# Patient Record
Sex: Female | Born: 1990 | Race: White | Hispanic: No | Marital: Single | Smoking: Never smoker
Health system: Southern US, Community
[De-identification: ages and names within clinical notes are randomized; demographics above are authoritative.]

## PROBLEM LIST (undated history)

## (undated) DIAGNOSIS — Z8742 Personal history of other diseases of the female genital tract: Secondary | ICD-10-CM

## (undated) DIAGNOSIS — T7840XA Allergy, unspecified, initial encounter: Secondary | ICD-10-CM

## (undated) DIAGNOSIS — A749 Chlamydial infection, unspecified: Secondary | ICD-10-CM

## (undated) DIAGNOSIS — Z973 Presence of spectacles and contact lenses: Secondary | ICD-10-CM

## (undated) DIAGNOSIS — N898 Other specified noninflammatory disorders of vagina: Secondary | ICD-10-CM

## (undated) DIAGNOSIS — Z309 Encounter for contraceptive management, unspecified: Principal | ICD-10-CM

## (undated) DIAGNOSIS — F419 Anxiety disorder, unspecified: Secondary | ICD-10-CM

## (undated) DIAGNOSIS — Z8619 Personal history of other infectious and parasitic diseases: Principal | ICD-10-CM

## (undated) DIAGNOSIS — A599 Trichomoniasis, unspecified: Secondary | ICD-10-CM

## (undated) DIAGNOSIS — B379 Candidiasis, unspecified: Secondary | ICD-10-CM

## (undated) DIAGNOSIS — K219 Gastro-esophageal reflux disease without esophagitis: Secondary | ICD-10-CM

## (undated) DIAGNOSIS — N83209 Unspecified ovarian cyst, unspecified side: Secondary | ICD-10-CM

## (undated) DIAGNOSIS — B9689 Other specified bacterial agents as the cause of diseases classified elsewhere: Secondary | ICD-10-CM

## (undated) DIAGNOSIS — N760 Acute vaginitis: Secondary | ICD-10-CM

## (undated) DIAGNOSIS — R87629 Unspecified abnormal cytological findings in specimens from vagina: Secondary | ICD-10-CM

## (undated) HISTORY — DX: Other specified noninflammatory disorders of vagina: N89.8

## (undated) HISTORY — DX: Allergy, unspecified, initial encounter: T78.40XA

## (undated) HISTORY — DX: Encounter for contraceptive management, unspecified: Z30.9

## (undated) HISTORY — DX: Gastro-esophageal reflux disease without esophagitis: K21.9

## (undated) HISTORY — PX: FOOT SURGERY: SHX648

## (undated) HISTORY — DX: Acute vaginitis: N76.0

## (undated) HISTORY — DX: Personal history of other infectious and parasitic diseases: Z86.19

## (undated) HISTORY — DX: Anxiety disorder, unspecified: F41.9

## (undated) HISTORY — DX: Candidiasis, unspecified: B37.9

## (undated) HISTORY — DX: Trichomoniasis, unspecified: A59.9

## (undated) HISTORY — DX: Other specified bacterial agents as the cause of diseases classified elsewhere: B96.89

## (undated) HISTORY — DX: Unspecified abnormal cytological findings in specimens from vagina: R87.629

## (undated) HISTORY — DX: Personal history of other diseases of the female genital tract: Z87.42

## (undated) HISTORY — DX: Chlamydial infection, unspecified: A74.9

---

## 2009-01-14 HISTORY — PX: FOOT SURGERY: SHX648

## 2013-04-15 ENCOUNTER — Emergency Department (HOSPITAL_COMMUNITY)
Admission: EM | Admit: 2013-04-15 | Discharge: 2013-04-15 | Disposition: A | Discharge status: Home / Self Care | Payer: Self-pay | Attending: Emergency Medicine | Admitting: Emergency Medicine

## 2013-04-15 ENCOUNTER — Encounter (HOSPITAL_COMMUNITY): Payer: Self-pay | Admitting: Emergency Medicine

## 2013-04-15 DIAGNOSIS — S76219A Strain of adductor muscle, fascia and tendon of unspecified thigh, initial encounter: Secondary | ICD-10-CM

## 2013-04-15 DIAGNOSIS — X58XXXA Exposure to other specified factors, initial encounter: Secondary | ICD-10-CM | POA: Insufficient documentation

## 2013-04-15 DIAGNOSIS — Y9289 Other specified places as the place of occurrence of the external cause: Secondary | ICD-10-CM | POA: Insufficient documentation

## 2013-04-15 DIAGNOSIS — Y9389 Activity, other specified: Secondary | ICD-10-CM | POA: Insufficient documentation

## 2013-04-15 DIAGNOSIS — IMO0002 Reserved for concepts with insufficient information to code with codable children: Secondary | ICD-10-CM | POA: Insufficient documentation

## 2013-04-15 NOTE — ED Provider Notes (Signed)
CSN: [id number]     Arrival date & time 04/15/13  0941 History   First MD Initiated Contact with Patient 04/15/13 1057     Chief Complaint  Patient presents with  . Leg Pain     (Consider location/radiation/quality/duration/timing/severity/associated sxs/prior Treatment) HPI Comments: Patient is a 23 year old female who presents to the emergency department with right upper thigh pain. The patient states that this started to bother her early this morning when she awakened. She is unaware of any known injury. The patient states that she takes a walk almost every day, and there are inclines and declines in the past that she does walking. She's not had any previous operations or procedures involving the right thigh or groin area. Patient presents now for evaluation of this particular problem.  The history is provided by the patient.    History reviewed. No pertinent past medical history. Past Surgical History  Procedure Laterality Date  . Foot surgery     No family history on file. History  Substance Use Topics  . Smoking status: Never Smoker   . Smokeless tobacco: Not on file  . Alcohol Use: Yes     Comment: occasional   OB History   Grav Para Term Preterm Abortions TAB SAB Ect Mult Living                 Review of Systems  Constitutional: Negative for activity change.       All ROS Neg except as noted in HPI  HENT: Negative for nosebleeds.   Eyes: Negative for photophobia and discharge.  Respiratory: Negative for cough, shortness of breath and wheezing.   Cardiovascular: Negative for chest pain and palpitations.  Gastrointestinal: Negative for abdominal pain and blood in stool.  Genitourinary: Negative for dysuria, frequency and hematuria.  Musculoskeletal: Positive for myalgias. Negative for arthralgias, back pain and neck pain.  Skin: Negative.   Neurological: Negative for dizziness, seizures and speech difficulty.  Psychiatric/Behavioral: Negative for hallucinations and  confusion.      Allergies  Review of patient's allergies indicates no known allergies.  Home Medications  No current outpatient prescriptions on file. BP 120/70  Pulse 96  Temp(Src) 97.8 F (36.6 C)  Resp 18  Ht 5\' 6"  (1.676 m)  Wt 182 lb (82.555 kg)  BMI 29.39 kg/m2  SpO2 100%  LMP 03/25/2013 Physical Exam  Nursing note and vitals reviewed. Constitutional: She is oriented to person, place, and time. She appears well-developed and well-nourished.  Non-toxic appearance.  HENT:  Head: Normocephalic.  Right Ear: Tympanic membrane and external ear normal.  Left Ear: Tympanic membrane and external ear normal.  Eyes: EOM and lids are normal. Pupils are equal, round, and reactive to light.  Neck: Normal range of motion. Neck supple. Carotid bruit is not present.  Cardiovascular: Normal rate, regular rhythm, normal heart sounds, intact distal pulses and normal pulses.   Pulmonary/Chest: Breath sounds normal. No respiratory distress.  Abdominal: Soft. Bowel sounds are normal. There is no tenderness. There is no guarding.  Musculoskeletal: Normal range of motion.  There is full range of motion of the toes on the right. Full range of motion of the ankle. The Achilles tendon is intact. There is no red streaking of the upper or lower right leg. There is no calf tenderness or swelling noted. With range of motion there is spasm of the upper thigh extending into the lower abdomen and groin. There is no evidence of hernia.  Lymphadenopathy:  Head (right side): No submandibular adenopathy present.       Head (left side): No submandibular adenopathy present.    She has no cervical adenopathy.  Neurological: She is alert and oriented to person, place, and time. She has normal strength. No cranial nerve deficit or sensory deficit.  Skin: Skin is warm and dry.  Psychiatric: She has a normal mood and affect. Her speech is normal.    ED Course  Procedures (including critical care time) Labs  Review Labs Reviewed - No data to display Imaging Review No results found.   EKG Interpretation None      MDM Vital signs are well within normal limits. The pulse oximetry is 100% on room air. Within normal limits by my interpretation.  The examination is consistent with a groin strain. I've instructed the patient to use warm tub soaks one or 2 times daily. I've instructed her to use 600 mg of ibuprofen 3 or 4 times daily with food. I've asked her to rest her thigh and groin area is much as possible.    Final diagnoses:  None    *I have reviewed nursing notes, vital signs, and all appropriate lab and imaging results for this patient.Kathie Dike, PA-C 04/15/13 1137

## 2013-04-15 NOTE — ED Notes (Signed)
C/O pain rt thigh area unrelated to injury or anything. Describes pain as "shooting".

## 2013-04-15 NOTE — Discharge Instructions (Signed)
Your examination is consistent with a groin strain. Please use warm tub soaks one or 2 times daily. Please rest your thigh and groin area is much as possible. Use ibuprofen 600 mg 3 or 4 times daily with food. Please see your primary physician, or Dr. Hilda LiasKeeling for additional evaluation and management if not improving.

## 2013-04-15 NOTE — ED Notes (Signed)
Pt c/o right upper thigh pain that radiates into right groin and abdomen with nausea. Denies v/d.

## 2013-04-15 NOTE — ED Provider Notes (Signed)
Medical screening examination/treatment/procedure(s) were performed by non-physician practitioner and as supervising physician I was immediately available for consultation/collaboration.   EKG Interpretation None        Mackenzie CoKevin M Amias Hutchinson, MD 04/15/13 1140

## 2013-06-02 ENCOUNTER — Encounter: Payer: BC Managed Care – PPO | Admitting: Adult Health

## 2013-06-02 ENCOUNTER — Encounter: Payer: Self-pay | Admitting: Adult Health

## 2013-06-02 ENCOUNTER — Ambulatory Visit (INDEPENDENT_AMBULATORY_CARE_PROVIDER_SITE_OTHER): Payer: BC Managed Care – PPO | Admitting: Adult Health

## 2013-06-02 VITALS — BP 122/70 | Ht 65.5 in | Wt 184.0 lb

## 2013-06-02 DIAGNOSIS — Z309 Encounter for contraceptive management, unspecified: Secondary | ICD-10-CM | POA: Insufficient documentation

## 2013-06-02 DIAGNOSIS — Z3202 Encounter for pregnancy test, result negative: Secondary | ICD-10-CM

## 2013-06-02 DIAGNOSIS — Z3049 Encounter for surveillance of other contraceptives: Secondary | ICD-10-CM

## 2013-06-02 LAB — POCT URINE PREGNANCY: Preg Test, Ur: NEGATIVE

## 2013-06-02 MED ORDER — NORGESTIMATE-ETH ESTRADIOL 0.25-35 MG-MCG PO TABS: 1.0000 | ORAL_TABLET | Freq: Every day | ORAL | Status: DC

## 2013-06-02 NOTE — Progress Notes (Signed)
Subjective:     Patient ID: Mackenzie Harris, female   DOB: 1990/08/30, 23 y.o.   MRN: [id number]  HPI Helmut Musterlicia is a 23 year old white female in to get on birth control.No complaints.  Review of Systems See HPI Reviewed past medical,surgical, social and family history. Reviewed medications and allergies.     Objective:   Physical Exam BP 122/70  Ht 5' 5.5" (1.664 m)  Wt 184 lb (83.462 kg)  BMI 30.14 kg/m2  LMP 05/07/2015UPT negative, talk only, wants to get back on birth control and wants the pill.    Assessment:       Contraceptive management  Plan:     Rx sprintec disp 3 packs take 1 daily with 4 refills, start with next period and use condoms Follow up in 3 months Review handout on OC use

## 2013-06-02 NOTE — Patient Instructions (Signed)
Oral Contraception Use Oral contraceptive pills (OCPs) are medicines taken to prevent pregnancy. OCPs work by preventing the ovaries from releasing eggs. The hormones in OCPs also cause the cervical mucus to thicken, preventing the sperm from entering the uterus. The hormones also cause the uterine lining to become thin, not allowing a fertilized egg to attach to the inside of the uterus. OCPs are highly effective when taken exactly as prescribed. However, OCPs do not prevent sexually transmitted diseases (STDs). Safe sex practices, such as using condoms along with an OCP, can help prevent STDs. Before taking OCPs, you may have a physical exam and Pap test. Your health care provider may also order blood tests if necessary. Your health care provider will make sure you are a good candidate for oral contraception. Discuss with your health care provider the possible side effects of the OCP you may be prescribed. When starting an OCP, it can take 2 to 3 months for the body to adjust to the changes in hormone levels in your body.  HOW TO TAKE ORAL CONTRACEPTIVE PILLS Your health care provider may advise you on how to start taking the first cycle of OCPs. Otherwise, you can:   Start on day 1 of your menstrual period. You will not need any backup contraceptive protection with this start time.   Start on the first Sunday after your menstrual period or the day you get your prescription. In these cases, you will need to use backup contraceptive protection for the first week.   Start the pill at any time of your cycle. If you take the pill within 5 days of the start of your period, you are protected against pregnancy right away. In this case, you will not need a backup form of birth control. If you start at any other time of your menstrual cycle, you will need to use another form of birth control for 7 days. If your OCP is the type called a minipill, it will protect you from pregnancy after taking it for 2 days (48  hours). After you have started taking OCPs:   If you forget to take 1 pill, take it as soon as you remember. Take the next pill at the regular time.   If you miss 2 or more pills, call your health care provider because different pills have different instructions for missed doses. Use backup birth control until your next menstrual period starts.   If you use a 28-day pack that contains inactive pills and you miss 1 of the last 7 pills (pills with no hormones), it will not matter. Throw away the rest of the nonhormone pills and start a new pill pack.  No matter which day you start the OCP, you will always start a new pack on that same day of the week. Have an extra pack of OCPs and a backup contraceptive method available in case you miss some pills or lose your OCP pack.  HOME CARE INSTRUCTIONS   Do not smoke.   Always use a condom to protect against STDs. OCPs do not protect against STDs.   Use a calendar to mark your menstrual period days.   Read the information and directions that came with your OCP. Talk to your health care provider if you have questions.  SEEK MEDICAL CARE IF:   You develop nausea and vomiting.   You have abnormal vaginal discharge or bleeding.   You develop a rash.   You miss your menstrual period.   You are losing   your hair.   You need treatment for mood swings or depression.   You get dizzy when taking the OCP.   You develop acne from taking the OCP.   You become pregnant.  SEEK IMMEDIATE MEDICAL CARE IF:   You develop chest pain.   You develop shortness of breath.   You have an uncontrolled or severe headache.   You develop numbness or slurred speech.   You develop visual problems.   You develop pain, redness, and swelling in the legs.  Document Released: 12/20/2010 Document Revised: 09/02/2012 Document Reviewed: 06/21/2012 [facility] Patient Information 2014 [place], [place]. Start with next period Use condoms Follow up  in 3 months

## 2013-06-16 ENCOUNTER — Encounter: Payer: BC Managed Care – PPO | Admitting: Adult Health

## 2014-02-02 ENCOUNTER — Ambulatory Visit (INDEPENDENT_AMBULATORY_CARE_PROVIDER_SITE_OTHER): Payer: BLUE CROSS/BLUE SHIELD | Admitting: Adult Health

## 2014-02-02 ENCOUNTER — Other Ambulatory Visit (HOSPITAL_COMMUNITY)
Admission: RE | Admit: 2014-02-02 | Discharge: 2014-02-02 | Disposition: A | Discharge status: Home / Self Care | Payer: BLUE CROSS/BLUE SHIELD | Source: Ambulatory Visit | Attending: Adult Health | Admitting: Adult Health

## 2014-02-02 ENCOUNTER — Encounter: Payer: Self-pay | Admitting: Adult Health

## 2014-02-02 VITALS — BP 116/72 | Ht 66.5 in | Wt 173.0 lb

## 2014-02-02 DIAGNOSIS — Z113 Encounter for screening for infections with a predominantly sexual mode of transmission: Secondary | ICD-10-CM | POA: Insufficient documentation

## 2014-02-02 DIAGNOSIS — Z01419 Encounter for gynecological examination (general) (routine) without abnormal findings: Secondary | ICD-10-CM

## 2014-02-02 DIAGNOSIS — Z01411 Encounter for gynecological examination (general) (routine) with abnormal findings: Secondary | ICD-10-CM | POA: Insufficient documentation

## 2014-02-02 DIAGNOSIS — Z3041 Encounter for surveillance of contraceptive pills: Secondary | ICD-10-CM

## 2014-02-02 MED ORDER — NORGESTIMATE-ETH ESTRADIOL 0.25-35 MG-MCG PO TABS: 1.0000 | ORAL_TABLET | Freq: Every day | ORAL | Status: DC

## 2014-02-02 NOTE — Progress Notes (Signed)
Patient ID: Mackenzie Harris, female   DOB: Dec 08, 1990, 24 y.o.   MRN: [id number] History of Present Illness: Mackenzie Harris is a 24 year old white female in for pap and physical.No complaints is happy with OCs.   Current Medications, Allergies, Past Medical History, Past Surgical History, Family History and Social History were [facility].     Review of Systems: Patient denies any headaches, blurred vision, shortness of breath, chest pain, abdominal pain, problems with bowel movements, urination, or intercourse. No joint pain or mood swings, does have headache and chest pain when gets anxious.    Physical Exam:BP 116/72 mmHg  Ht 5' 6.5" (1.689 m)  Wt 173 lb (78.472 kg)  BMI 27.51 kg/m2  LMP 12/31/2013 General:  Well developed, well nourished, no acute distress Skin:  Warm and dry Neck:  Midline trachea, normal thyroid Lungs; Clear to auscultation bilaterally Breast:  No dominant palpable mass, retraction, or nipple discharge Cardiovascular: Regular rate and rhythm Abdomen:  Soft, non tender, no hepatosplenomegaly Pelvic:  External genitalia is normal in appearance.  The vagina is normal in appearance, with period like blood at os and mucous.    The cervix is everted at os, pap with GC/CHL performed.  Uterus is felt to be normal size, shape, and contour.  No   adnexal masses or tenderness noted. Extremities:  No swelling or varicosities noted Psych:  No mood changes,alert and cooperative,seems happy, works 3rd shift. Declines labs.  Impression: Well woman gyn exam with pap Contraceptive management    Plan: Refilled sprintec x 1 year Physical in 1 year Call prn problems

## 2014-02-02 NOTE — Patient Instructions (Signed)
Physical in 1 year Call prn problems

## 2014-02-03 LAB — CYTOLOGY - PAP

## 2014-02-07 ENCOUNTER — Telehealth: Payer: Self-pay | Admitting: Adult Health

## 2014-02-07 NOTE — Telephone Encounter (Signed)
No voice mail, if she calls needs colpo

## 2014-02-07 NOTE — Telephone Encounter (Signed)
Pt aware pap abnormal needs colpo,, to make appt with Dr Emelda FearFerguson or Dr Despina HiddenEure

## 2014-02-14 ENCOUNTER — Encounter: Payer: Self-pay | Admitting: Obstetrics & Gynecology

## 2014-02-14 ENCOUNTER — Ambulatory Visit (INDEPENDENT_AMBULATORY_CARE_PROVIDER_SITE_OTHER): Payer: BLUE CROSS/BLUE SHIELD | Admitting: Obstetrics & Gynecology

## 2014-02-14 VITALS — BP 132/100 | Wt 161.0 lb

## 2014-02-14 DIAGNOSIS — Z3202 Encounter for pregnancy test, result negative: Secondary | ICD-10-CM

## 2014-02-14 DIAGNOSIS — R87622 Low grade squamous intraepithelial lesion on cytologic smear of vagina (LGSIL): Secondary | ICD-10-CM

## 2014-02-14 LAB — POCT URINE PREGNANCY: PREG TEST UR: NEGATIVE

## 2014-02-14 NOTE — Progress Notes (Signed)
Patient ID: Mackenzie Harris, female   DOB: 07/09/1990, 24 y.o.   MRN: [id number] Colposcopy Procedure Note  Indications: Pap smear 14 days months ago showed: low-grade squamous intraepithelial neoplasia (LGSIL - encompassing HPV,mild dysplasia,CIN I). The prior pap showed no abnormalities.  Prior cervical/vaginal disease: . Prior cervical treatment: .  Procedure Details  The risks and benefits of the procedure and Written informed consent obtained.  Speculum placed in vagina and excellent visualization of cervix achieved, cervix swabbed x 3 with acetic acid solution.  Findings: Cervix: no visible lesions, no mosaicism and no punctation; no biopsies taken. Wide extension of squamo columnar epithelium Vaginal inspection: normal without visible lesions. Vulvar colposcopy: vulvar colposcopy not performed.  Specimens: none  Complications: none.  Plan: Follow up Pap smear in 1 year and add HPV identification

## 2014-04-12 ENCOUNTER — Telehealth: Payer: Self-pay | Admitting: Adult Health

## 2014-04-12 NOTE — Telephone Encounter (Signed)
Spoke with pt. Pt stopped Sprintec in Jan. She had some spotting in Feb. Nothing in March. Negative home pregnancy test. I advised she would need to be seen. Pt voiced understanding. Call transferred to front desk for appt. JSY

## 2014-04-15 ENCOUNTER — Ambulatory Visit: Payer: BLUE CROSS/BLUE SHIELD | Admitting: Adult Health

## 2014-04-27 ENCOUNTER — Encounter (HOSPITAL_COMMUNITY): Payer: Self-pay | Admitting: *Deleted

## 2014-04-27 ENCOUNTER — Emergency Department (HOSPITAL_COMMUNITY)
Admission: EM | Admit: 2014-04-27 | Discharge: 2014-04-27 | Disposition: A | Discharge status: Home / Self Care | Payer: BLUE CROSS/BLUE SHIELD | Attending: Emergency Medicine | Admitting: Emergency Medicine

## 2014-04-27 DIAGNOSIS — K088 Other specified disorders of teeth and supporting structures: Secondary | ICD-10-CM | POA: Diagnosis present

## 2014-04-27 DIAGNOSIS — F419 Anxiety disorder, unspecified: Secondary | ICD-10-CM | POA: Diagnosis not present

## 2014-04-27 DIAGNOSIS — Z79899 Other long term (current) drug therapy: Secondary | ICD-10-CM | POA: Diagnosis not present

## 2014-04-27 DIAGNOSIS — K047 Periapical abscess without sinus: Secondary | ICD-10-CM | POA: Diagnosis not present

## 2014-04-27 MED ORDER — AMOXICILLIN 250 MG PO CAPS
500.0000 mg | ORAL_CAPSULE | Freq: Once | ORAL | Status: AC
  Administered 2014-04-27: 500 mg via ORAL
  Filled 2014-04-27: qty 2

## 2014-04-27 MED ORDER — TRAMADOL HCL 50 MG PO TABS
50.0000 mg | ORAL_TABLET | Freq: Once | ORAL | Status: AC
  Administered 2014-04-27: 50 mg via ORAL
  Filled 2014-04-27: qty 1

## 2014-04-27 MED ORDER — AMOXICILLIN 500 MG PO CAPS: 500.0000 mg | ORAL_CAPSULE | Freq: Three times a day (TID) | ORAL | Status: DC

## 2014-04-27 MED ORDER — TRAMADOL HCL 50 MG PO TABS: 50.0000 mg | ORAL_TABLET | Freq: Four times a day (QID) | ORAL | Status: DC | PRN

## 2014-04-27 NOTE — ED Provider Notes (Signed)
CSN: [id number]     Arrival date & time 04/27/14  1900 History   First MD Initiated Contact with Patient 04/27/14 2011     Chief Complaint  Patient presents with  . Dental Pain     (Consider location/radiation/quality/duration/timing/severity/associated sxs/prior Treatment) The history is provided by the patient.   Mackenzie Harris is a 24 y.o. female presenting with a 2 week history of dental pain and gingival swelling.   The patient has a history of decay and fracture of the tooth involved which has recently started to cause increased  pain.  There has been no fevers, chills, nausea or vomiting, also no complaint of difficulty swallowing, although chewing makes pain worse. She has had a bad taste and drainage from around the tooth.  The patient has tried oragel without relief of symptoms.  Ibuprofen provides transient relief.  She is scheduled to see a dentist within  The next 10 days.      Past Medical History  Diagnosis Date  . GERD (gastroesophageal reflux disease)   . Allergy   . Contraceptive management 06/02/2013  . Anxiety    Past Surgical History  Procedure Laterality Date  . Foot surgery     Family History  Problem Relation Age of Onset  . Diabetes Mother     borderline  . Hypertension Father   . Diabetes Maternal Grandmother   . Hypertension Maternal Grandmother   . Diabetes Maternal Grandfather   . Hypertension Maternal Grandfather   . Alzheimer's disease Paternal Grandfather    History  Substance Use Topics  . Smoking status: Never Smoker   . Smokeless tobacco: Never Used  . Alcohol Use: Yes     Comment: occasional   OB History    Gravida Para Term Preterm AB TAB SAB Ectopic Multiple Living   0 0 0 0 0 0 0 0 0 0      Review of Systems  Constitutional: Negative for fever.  HENT: Positive for dental problem. Negative for facial swelling and sore throat.   Respiratory: Negative for shortness of breath.   Musculoskeletal: Negative for neck pain and neck  stiffness.      Allergies  Review of patient's allergies indicates no known allergies.  Home Medications   Prior to Admission medications   Medication Sig Start Date End Date Taking? Authorizing Provider  ibuprofen (ADVIL,MOTRIN) 200 MG tablet Take 800 mg by mouth every 6 (six) hours as needed for moderate pain.   Yes Historical Provider, MD  norgestimate-ethinyl estradiol (ORTHO-CYCLEN,SPRINTEC,PREVIFEM) 0.25-35 MG-MCG tablet Take 1 tablet by mouth daily. 02/02/14  Yes Adline PotterJennifer A Griffin, NP  amoxicillin (AMOXIL) 500 MG capsule Take 1 capsule (500 mg total) by mouth 3 (three) times daily. 04/27/14   Burgess AmorJulie Eveleen Mcnear, PA-C  traMADol (ULTRAM) 50 MG tablet Take 1 tablet (50 mg total) by mouth every 6 (six) hours as needed. 04/27/14   Burgess AmorJulie Ethlyn Alto, PA-C   BP 116/67 mmHg  Pulse 64  Temp(Src) 98.2 F (36.8 C) (Oral)  Resp 20  Ht 5\' 7"  (1.702 m)  Wt 182 lb (82.555 kg)  BMI 28.50 kg/m2  SpO2 100%  LMP 04/11/2014 Physical Exam  Constitutional: She is oriented to person, place, and time. She appears well-developed and well-nourished. No distress.  HENT:  Head: Normocephalic and atraumatic.  Right Ear: Tympanic membrane and external ear normal.  Left Ear: Tympanic membrane and external ear normal.  Mouth/Throat: Oropharynx is clear and moist and mucous membranes are normal. No oral lesions. No trismus in the jaw.  Dental abscesses present.    Eyes: Conjunctivae are normal.  Neck: Normal range of motion. Neck supple.  Cardiovascular: Normal rate and normal heart sounds.   Pulmonary/Chest: Effort normal.  Abdominal: She exhibits no distension.  Musculoskeletal: Normal range of motion.  Lymphadenopathy:    She has no cervical adenopathy.  Neurological: She is alert and oriented to person, place, and time.  Skin: Skin is warm and dry. No erythema.  Psychiatric: She has a normal mood and affect.    ED Course  Procedures (including critical care time) Labs Review Labs Reviewed - No data to  display  Imaging Review No results found.   EKG Interpretation None      MDM   Final diagnoses:  Dental infection    Amoxil, tramadol.   F/u with dentistry which pt has arranged.    The patient appears reasonably screened and/or stabilized for discharge and I doubt any other medical condition or other [facility] requiring further screening, evaluation, or treatment in the ED at this time prior to discharge.     Burgess Amor, PA-C 04/27/14 2051  Mancel Bale, MD 04/28/14 [id number]

## 2014-04-27 NOTE — Discharge Instructions (Signed)
Dental Abscess A dental abscess is a collection of infected fluid (pus) from a bacterial infection in the inner part of the tooth (pulp). It usually occurs at the end of the tooth's root.  CAUSES   Severe tooth decay.  Trauma to the tooth that allows bacteria to enter into the pulp, such as a broken or chipped tooth. SYMPTOMS   Severe pain in and around the infected tooth.  Swelling and redness around the abscessed tooth or in the mouth or face.  Tenderness.  Pus drainage.  Bad breath.  Bitter taste in the mouth.  Difficulty swallowing.  Difficulty opening the mouth.  Nausea.  Vomiting.  Chills.  Swollen neck glands. DIAGNOSIS   A medical and dental history will be taken.  An examination will be performed by tapping on the abscessed tooth.  X-rays may be taken of the tooth to identify the abscess. TREATMENT The goal of treatment is to eliminate the infection. You may be prescribed antibiotic medicine to stop the infection from spreading. A root canal may be performed to save the tooth. If the tooth cannot be saved, it may be pulled (extracted) and the abscess may be drained.  HOME CARE INSTRUCTIONS  Only take over-the-counter or prescription medicines for pain, fever, or discomfort as directed by your caregiver.  Rinse your mouth (gargle) often with salt water ( tsp salt in 8 oz [250 ml] of warm water) to relieve pain or swelling.  Do not drive after taking pain medicine (narcotics).  Do not apply heat to the outside of your face.  Return to your dentist for further treatment as directed. SEEK MEDICAL CARE IF:  Your pain is not helped by medicine.  Your pain is getting worse instead of better. SEEK IMMEDIATE MEDICAL CARE IF:  You have a fever or persistent symptoms for more than 2-3 days.  You have a fever and your symptoms suddenly get worse.  You have chills or a very bad headache.  You have problems breathing or swallowing.  You have trouble  opening your mouth.  You have swelling in the neck or around the eye. Document Released: 12/31/2004 Document Revised: 09/25/2011 Document Reviewed: 04/10/2010 [facility] Patient Information 2015 [place], [place]. This information is not intended to replace advice given to you by your health care provider. Make sure you discuss any questions you have with your health care provider.  As discussed,  You probably do have early infection around this fractured tooth.  Complete your entire course of antibiotics as prescribed.  You  may use the tramadol for pain relief but do not drive within 4 hours of taking as this will make you drowsy.  Avoid applying heat or ice to this abscess area which can worsen your symptoms.  You may use warm salt water swish and spit treatment or half peroxide and water swish and spit after meals to keep this area clean as discussed.  See your dentist as planned.

## 2014-04-27 NOTE — ED Notes (Signed)
Lt upper dental pain for 2 weeks and getting worse

## 2014-06-09 ENCOUNTER — Encounter: Payer: Self-pay | Admitting: Adult Health

## 2014-06-09 ENCOUNTER — Ambulatory Visit (INDEPENDENT_AMBULATORY_CARE_PROVIDER_SITE_OTHER): Payer: BLUE CROSS/BLUE SHIELD | Admitting: Adult Health

## 2014-06-09 VITALS — BP 124/78 | HR 56 | Ht 66.0 in | Wt 189.5 lb

## 2014-06-09 DIAGNOSIS — B379 Candidiasis, unspecified: Secondary | ICD-10-CM | POA: Insufficient documentation

## 2014-06-09 DIAGNOSIS — L298 Other pruritus: Secondary | ICD-10-CM

## 2014-06-09 DIAGNOSIS — N898 Other specified noninflammatory disorders of vagina: Secondary | ICD-10-CM

## 2014-06-09 HISTORY — DX: Candidiasis, unspecified: B37.9

## 2014-06-09 LAB — POCT WET PREP (WET MOUNT): WBC, Wet Prep HPF POC: POSITIVE

## 2014-06-09 MED ORDER — FLUCONAZOLE 150 MG PO TABS: ORAL_TABLET | ORAL | Status: DC

## 2014-06-09 MED ORDER — NYSTATIN-TRIAMCINOLONE 100000-0.1 UNIT/GM-% EX OINT: 1.0000 "application " | TOPICAL_OINTMENT | Freq: Two times a day (BID) | CUTANEOUS | Status: DC

## 2014-06-09 NOTE — Patient Instructions (Signed)
Monilial Vaginitis Vaginitis in a soreness, swelling and redness (inflammation) of the vagina and vulva. Monilial vaginitis is not a sexually transmitted infection. CAUSES  Yeast vaginitis is caused by yeast (candida) that is normally found in your vagina. With a yeast infection, the candida has overgrown in number to a point that upsets the chemical balance. SYMPTOMS   White, thick vaginal discharge.  Swelling, itching, redness and irritation of the vagina and possibly the lips of the vagina (vulva).  Burning or painful urination.  Painful intercourse. DIAGNOSIS  Things that may contribute to monilial vaginitis are:  Postmenopausal and virginal states.  Pregnancy.  Infections.  Being tired, sick or stressed, especially if you had monilial vaginitis in the past.  Diabetes. Good control will help lower the chance.  Birth control pills.  Tight fitting garments.  Using bubble bath, feminine sprays, douches or deodorant tampons.  Taking certain medications that kill germs (antibiotics).  Sporadic recurrence can occur if you become ill. TREATMENT  Your caregiver will give you medication.  There are several kinds of anti monilial vaginal creams and suppositories specific for monilial vaginitis. For recurrent yeast infections, use a suppository or cream in the vagina 2 times a week, or as directed.  Anti-monilial or steroid cream for the itching or irritation of the vulva may also be used. Get your caregiver's permission.  Painting the vagina with methylene blue solution may help if the monilial cream does not work.  Eating yogurt may help prevent monilial vaginitis. HOME CARE INSTRUCTIONS   Finish all medication as prescribed.  Do not have sex until treatment is completed or after your caregiver tells you it is okay.  Take warm sitz baths.  Do not douche.  Do not use tampons, especially scented ones.  Wear cotton underwear.  Avoid tight pants and panty  hose.  Tell your sexual partner that you have a yeast infection. They should go to their caregiver if they have symptoms such as mild rash or itching.  Your sexual partner should be treated as well if your infection is difficult to eliminate.  Practice safer sex. Use condoms.  Some vaginal medications cause latex condoms to fail. Vaginal medications that harm condoms are:  Cleocin cream.  Butoconazole (Femstat).  Terconazole (Terazol) vaginal suppository.  Miconazole (Monistat) (may be purchased over the counter). SEEK MEDICAL CARE IF:   You have a temperature by mouth above 102 F (38.9 C).  The infection is getting worse after 2 days of treatment.  The infection is not getting better after 3 days of treatment.  You develop blisters in or around your vagina.  You develop vaginal bleeding, and it is not your menstrual period.  You have pain when you urinate.  You develop intestinal problems.  You have pain with sexual intercourse. Document Released: 10/10/2004 Document Revised: 03/25/2011 Document Reviewed: 06/24/2008 [facility] Patient Information 2015 [employer], [place]. This information is not intended to replace advice given to you by your health care provider. Make sure you discuss any questions you have with your health care provider. Follow up prn No more bubble baths

## 2014-06-09 NOTE — Progress Notes (Signed)
Subjective:     Patient ID: Mackenzie Harris, female   DOB: 1990-09-14, 24 y.o.   MRN: [id number]  HPI Helmut Musterlicia is a 24 year old white female in complaining of vaginal itch for about 3 days now.She did have bubble bath the day before the itching started.She has tried monistat without relief.  Review of Systems Patient denies any headaches, hearing loss, fatigue, blurred vision, shortness of breath, chest pain, abdominal pain, problems with bowel movements, urination, or intercourse. No joint pain or mood swings.+vaginal itch Reviewed past medical,surgical, social and family history. Reviewed medications and allergies.     Objective:   Physical Exam BP 124/78 mmHg  Pulse 56  Ht 5\' 6"  (1.676 m)  Wt 189 lb 8 oz (85.957 kg)  BMI 30.60 kg/m2  LMP 05/12/2014 Skin warm and dry.Pelvic: external genitalia is normal in appearance no lesions, vagina: white discharge, pink tinge, without odor, red at introitus,urethra has no lesions or masses noted, cervix:smooth, uterus: normal size, shape and contour, non tender, no masses felt, adnexa: no masses or tenderness noted. Bladder is non tender and no masses felt. Wet prep: + for few yeast and +WBCs.     Assessment:     Vaginal itch Vaginal discharge Yeast     Plan:     Rx diflucan 150 mg #2 take 1 now and 1 in 3 days with 1 refill Rx mytrex ointment use 2-3 x daily prn No more bubble baths Follow up prn Review handout on yeast infection

## 2015-02-28 ENCOUNTER — Ambulatory Visit (INDEPENDENT_AMBULATORY_CARE_PROVIDER_SITE_OTHER): Payer: BLUE CROSS/BLUE SHIELD | Admitting: Adult Health

## 2015-02-28 ENCOUNTER — Encounter: Payer: Self-pay | Admitting: Adult Health

## 2015-02-28 VITALS — BP 100/80 | HR 68 | Ht 66.0 in | Wt 207.0 lb

## 2015-02-28 DIAGNOSIS — Z8742 Personal history of other diseases of the female genital tract: Secondary | ICD-10-CM

## 2015-02-28 DIAGNOSIS — Z3202 Encounter for pregnancy test, result negative: Secondary | ICD-10-CM

## 2015-02-28 DIAGNOSIS — Z3041 Encounter for surveillance of contraceptive pills: Secondary | ICD-10-CM

## 2015-02-28 HISTORY — DX: Personal history of other diseases of the female genital tract: Z87.42

## 2015-02-28 LAB — POCT URINE PREGNANCY: PREG TEST UR: NEGATIVE

## 2015-02-28 MED ORDER — NORGESTIMATE-ETH ESTRADIOL 0.25-35 MG-MCG PO TABS: 1.0000 | ORAL_TABLET | Freq: Every day | ORAL | Status: DC

## 2015-02-28 NOTE — Progress Notes (Signed)
Subjective:     Patient ID: Mackenzie Harris, female   DOB: 04/24/1990, 25 y.o.   MRN: [id number]  HPI Mackenzie Harris is a 24 year old white female, in requesting refills on OCs.She had LSIL pap last year and had negative colpo, needs pap and physical.  Review of Systems Patient denies any headaches, hearing loss, fatigue, blurred vision, shortness of breath, chest pain, abdominal pain, problems with bowel movements, urination, or intercourse. No joint pain or mood swings. Reviewed past medical,surgical, social and family history. Reviewed medications and allergies.     Objective:   Physical Exam BP 100/80 mmHg  Pulse 68  Ht  (1.676 m)  Wt 207 lb (93.895 kg)  BMI 33.43 kg/m2  LMP 02/20/2015 UPT negtive, Skin warm and dry. Neck: mid line trachea, normal thyroid, good ROM, no lymphadenopathy noted. Lungs: clear to ausculation bilaterally. Cardiovascular: regular rate and rhythm.Will get scheduled for pap and physical ASAP.    Assessment:     Contraceptive management Hx abnormal pap    Plan:     Refilled spritnec Disp 3 packs take 1 daily with 4 refills,start back today and use condoms Return 2/24 for pap and physical

## 2015-02-28 NOTE — Patient Instructions (Signed)
Start back on sprintec today Use condoms  2/24 Pap and physical

## 2015-03-10 ENCOUNTER — Other Ambulatory Visit: Payer: BLUE CROSS/BLUE SHIELD | Admitting: Adult Health

## 2015-03-16 ENCOUNTER — Other Ambulatory Visit (HOSPITAL_COMMUNITY)
Admission: RE | Admit: 2015-03-16 | Discharge: 2015-03-16 | Disposition: A | Discharge status: Home / Self Care | Payer: BLUE CROSS/BLUE SHIELD | Source: Ambulatory Visit | Attending: Adult Health | Admitting: Adult Health

## 2015-03-16 ENCOUNTER — Ambulatory Visit (INDEPENDENT_AMBULATORY_CARE_PROVIDER_SITE_OTHER): Payer: BLUE CROSS/BLUE SHIELD | Admitting: Adult Health

## 2015-03-16 ENCOUNTER — Encounter: Payer: Self-pay | Admitting: Adult Health

## 2015-03-16 VITALS — BP 100/70 | HR 70 | Ht 66.0 in | Wt 211.5 lb

## 2015-03-16 DIAGNOSIS — Z1151 Encounter for screening for human papillomavirus (HPV): Secondary | ICD-10-CM | POA: Insufficient documentation

## 2015-03-16 DIAGNOSIS — N898 Other specified noninflammatory disorders of vagina: Secondary | ICD-10-CM | POA: Diagnosis not present

## 2015-03-16 DIAGNOSIS — Z01419 Encounter for gynecological examination (general) (routine) without abnormal findings: Secondary | ICD-10-CM

## 2015-03-16 DIAGNOSIS — Z8742 Personal history of other diseases of the female genital tract: Secondary | ICD-10-CM

## 2015-03-16 DIAGNOSIS — Z3041 Encounter for surveillance of contraceptive pills: Secondary | ICD-10-CM

## 2015-03-16 DIAGNOSIS — N760 Acute vaginitis: Secondary | ICD-10-CM

## 2015-03-16 DIAGNOSIS — Z113 Encounter for screening for infections with a predominantly sexual mode of transmission: Secondary | ICD-10-CM

## 2015-03-16 DIAGNOSIS — B9689 Other specified bacterial agents as the cause of diseases classified elsewhere: Secondary | ICD-10-CM

## 2015-03-16 HISTORY — DX: Other specified bacterial agents as the cause of diseases classified elsewhere: B96.89

## 2015-03-16 LAB — POCT WET PREP (WET MOUNT)
Clue Cells Wet Prep Whiff POC: NEGATIVE
WBC, Wet Prep HPF POC: POSITIVE

## 2015-03-16 MED ORDER — METRONIDAZOLE 500 MG PO TABS: 500.0000 mg | ORAL_TABLET | Freq: Two times a day (BID) | ORAL | Status: DC

## 2015-03-16 MED ORDER — FLUCONAZOLE 150 MG PO TABS: 150.0000 mg | ORAL_TABLET | Freq: Once | ORAL | Status: DC

## 2015-03-16 NOTE — Progress Notes (Signed)
Patient ID: Mackenzie Harris, female   DOB: Jul 19, 1990, 25 y.o.   MRN: [id number] History of Present Illness:  Mackenzie Harris is a 25 year old white female, in for a well woman gyn exam and pap, she had LSIL 02/02/14.She is happy with her OCs and has vaginal discharge with itching.Has used monistat.She says she is tired works 12 hour nights.  Current Medications, Allergies, Past Medical History, Past Surgical History, Family History and Social History were reviewed in [employer] record.     Review of Systems: Patient denies any headaches, hearing loss, blurred vision, shortness of breath, chest pain, abdominal pain, problems with bowel movements, urination, or intercourse. No joint pain or mood swings.See HPI for positives.    Physical Exam:BP 100/70 mmHg  Pulse 70  Ht  (1.676 m)  Wt 211 lb 8 oz (95.936 kg)  BMI 34.15 kg/m2  LMP 02/20/2015 General:  Well developed, well nourished, no acute distress Skin:  Warm and dry Neck:  Midline trachea, normal thyroid, good ROM, no lymphadenopathy Lungs; Clear to auscultation bilaterally Breast:  No dominant palpable mass, retraction, or nipple discharge Cardiovascular: Regular rate and rhythm Abdomen:  Soft, non tender, no hepatosplenomegaly Pelvic:  External genitalia is normal in appearance, no lesions.  The vagina is normal in appearance.Has creamy discharge, no odor. Urethra has no lesions or masses. The cervix is everted at os and friable with EC brush, pap with HPV and GC/CHL performed.  Uterus is felt to be normal size, shape, and contour.  No adnexal masses or tenderness noted.Bladder is non tender, no masses felt.Wet prep was + for WBCs and clue cells. Extremities/musculoskeletal:  No swelling or varicosities noted, no clubbing or cyanosis Psych:  No mood changes, alert and cooperative,seems happy Will check labs today.  Impression: Well woman gyn exam and pap History of abnormal pap Vaginal discharge Vaginal  itch BV Contraceptive management  STD screening   Plan: Continue sprintec, has refills Rx flagyl 500 mg 1 bid x 7 days, no alcohol, review handout on BV   Check CBC,CMP,TSH and lipids;A1c, vitamin D and HIV,RPR, and HSV 2 Physical in 1 year, pap in 3 if normal Rx diflucan 150 mg # 1, take 1 now with 3 refills

## 2015-03-16 NOTE — Patient Instructions (Signed)
Bacterial Vaginosis Bacterial vaginosis is a vaginal infection that occurs when the normal balance of bacteria in the vagina is disrupted. It results from an overgrowth of certain bacteria. This is the most common vaginal infection in women of childbearing age. Treatment is important to prevent complications, especially in pregnant women, as it can cause a premature delivery. CAUSES  Bacterial vaginosis is caused by an increase in harmful bacteria that are normally present in smaller amounts in the vagina. Several different kinds of bacteria can cause bacterial vaginosis. However, the reason that the condition develops is not fully understood. RISK FACTORS Certain activities or behaviors can put you at an increased risk of developing bacterial vaginosis, including:  Having a new sex partner or multiple sex partners.  Douching.  Using an intrauterine device (IUD) for contraception. Women do not get bacterial vaginosis from toilet seats, bedding, swimming pools, or contact with objects around them. SIGNS AND SYMPTOMS  Some women with bacterial vaginosis have no signs or symptoms. Common symptoms include:  Grey vaginal discharge.  A fishlike odor with discharge, especially after sexual intercourse.  Itching or burning of the vagina and vulva.  Burning or pain with urination. DIAGNOSIS  Your health care provider will take a medical history and examine the vagina for signs of bacterial vaginosis. A sample of vaginal fluid may be taken. Your health care provider will look at this sample under a microscope to check for bacteria and abnormal cells. A vaginal pH test may also be done.  TREATMENT  Bacterial vaginosis may be treated with antibiotic medicines. These may be given in the form of a pill or a vaginal cream. A second round of antibiotics may be prescribed if the condition comes back after treatment. Because bacterial vaginosis increases your risk for sexually transmitted diseases, getting  treated can help reduce your risk for chlamydia, gonorrhea, HIV, and herpes. HOME CARE INSTRUCTIONS   Only take over-the-counter or prescription medicines as directed by your health care provider.  If antibiotic medicine was prescribed, take it as directed. Make sure you finish it even if you start to feel better.  Tell all sexual partners that you have a vaginal infection. They should see their health care provider and be treated if they have problems, such as a mild rash or itching.  During treatment, it is important that you follow these instructions:  Avoid sexual activity or use condoms correctly.  Do not douche.  Avoid alcohol as directed by your health care provider.  Avoid breastfeeding as directed by your health care provider. SEEK MEDICAL CARE IF:   Your symptoms are not improving after 3 days of treatment.  You have increased discharge or pain.  You have a fever. MAKE SURE YOU:   Understand these instructions.  Will watch your condition.  Will get help right away if you are not doing well or get worse. FOR MORE INFORMATION  Centers for Disease Control and Prevention, Division of STD Prevention: [employer] American Sexual Health Association (ASHA): www.ashastd.org    This information is not intended to replace advice given to you by your health care provider. Make sure you discuss any questions you have with your health care provider.   Document Released: 12/31/2004 Document Revised: 01/21/2014 Document Reviewed: 08/12/2012 Elsevier Interactive Patient Education 2016 [employer]. No alcohol  No sex Physical in 1year

## 2015-03-17 LAB — COMPREHENSIVE METABOLIC PANEL
ALT: 6 IU/L (ref 0–32)
AST: 13 IU/L (ref 0–40)
Albumin/Globulin Ratio: 1.6 (ref 1.1–2.5)
Albumin: 4.1 g/dL (ref 3.5–5.5)
Alkaline Phosphatase: 55 IU/L (ref 39–117)
BUN/Creatinine Ratio: 16 (ref 8–20)
BUN: 12 mg/dL (ref 6–20)
Bilirubin Total: 0.3 mg/dL (ref 0.0–1.2)
CALCIUM: 9.5 mg/dL (ref 8.7–10.2)
CO2: 21 mmol/L (ref 18–29)
CREATININE: 0.74 mg/dL (ref 0.57–1.00)
Chloride: 102 mmol/L (ref 96–106)
GFR calc Af Amer: 129 mL/min/{1.73_m2} (ref >59)
GFR, EST NON AFRICAN AMERICAN: 112 mL/min/{1.73_m2} (ref >59)
Globulin, Total: 2.6 g/dL (ref 1.5–4.5)
Glucose: 70 mg/dL (ref 65–99)
Potassium: 4.3 mmol/L (ref 3.5–5.2)
Sodium: 141 mmol/L (ref 134–144)
TOTAL PROTEIN: 6.7 g/dL (ref 6.0–8.5)

## 2015-03-17 LAB — LIPID PANEL
CHOLESTEROL TOTAL: 157 mg/dL (ref 100–199)
Chol/HDL Ratio: 2.2 ratio units (ref 0.0–4.4)
HDL: 70 mg/dL (ref >39)
LDL CALC: 73 mg/dL (ref 0–99)
TRIGLYCERIDES: 68 mg/dL (ref 0–149)
VLDL CHOLESTEROL CAL: 14 mg/dL (ref 5–40)

## 2015-03-17 LAB — CBC
Hematocrit: 38 % (ref 34.0–46.6)
Hemoglobin: 12.6 g/dL (ref 11.1–15.9)
MCH: 29.6 pg (ref 26.6–33.0)
MCHC: 33.2 g/dL (ref 31.5–35.7)
MCV: 89 fL (ref 79–97)
PLATELETS: 288 10*3/uL (ref 150–379)
RBC: 4.25 x10E6/uL (ref 3.77–5.28)
RDW: 13.9 % (ref 12.3–15.4)
WBC: 9.2 10*3/uL (ref 3.4–10.8)

## 2015-03-17 LAB — HIV ANTIBODY (ROUTINE TESTING W REFLEX): HIV Screen 4th Generation wRfx: NONREACTIVE

## 2015-03-17 LAB — RPR: RPR Ser Ql: NONREACTIVE

## 2015-03-17 LAB — TSH: TSH: 2.75 u[IU]/mL (ref 0.450–4.500)

## 2015-03-17 LAB — VITAMIN D 25 HYDROXY (VIT D DEFICIENCY, FRACTURES): Vit D, 25-Hydroxy: 19.7 ng/mL — ABNORMAL LOW (ref 30.0–100.0)

## 2015-03-17 LAB — HEMOGLOBIN A1C
Est. average glucose Bld gHb Est-mCnc: 111 mg/dL
Hgb A1c MFr Bld: 5.5 % (ref 4.8–5.6)

## 2015-03-17 LAB — HSV 2 ANTIBODY, IGG: HSV 2 Glycoprotein G Ab, IgG: <0.91 index (ref 0.00–0.90)

## 2015-03-20 ENCOUNTER — Telehealth: Payer: Self-pay | Admitting: Adult Health

## 2015-03-20 LAB — CYTOLOGY - PAP

## 2015-03-20 MED ORDER — METRONIDAZOLE 500 MG PO TABS: ORAL_TABLET | ORAL | Status: DC

## 2015-03-20 MED ORDER — AZITHROMYCIN 500 MG PO TABS: ORAL_TABLET | ORAL | Status: DC

## 2015-03-20 NOTE — Telephone Encounter (Signed)
Left message to call about labs 

## 2015-03-20 NOTE — Telephone Encounter (Signed)
Pt had question about pap last year and CG/CHL was negative then

## 2015-03-20 NOTE — Telephone Encounter (Signed)
Pt aware of labs and vitamin D def. and need to take vitamin D 3 5000 IU daily and pap was +trich and +Chlamydia, will treat with azithromycin 500 mg # 2 take 2 po now and flagyl 500 mg # 4 take 4 po now and do POT 4/3 at 1:30 pm and [facility] and will [facility] [place] [birth year] [facility] [place], no sex til after POT.

## 2015-04-17 ENCOUNTER — Ambulatory Visit (INDEPENDENT_AMBULATORY_CARE_PROVIDER_SITE_OTHER): Payer: BLUE CROSS/BLUE SHIELD | Admitting: Adult Health

## 2015-04-17 ENCOUNTER — Encounter: Payer: Self-pay | Admitting: Adult Health

## 2015-04-17 VITALS — BP 132/72 | HR 72 | Ht 66.0 in | Wt 212.5 lb

## 2015-04-17 DIAGNOSIS — Z8619 Personal history of other infectious and parasitic diseases: Secondary | ICD-10-CM | POA: Diagnosis not present

## 2015-04-17 HISTORY — DX: Personal history of other infectious and parasitic diseases: Z86.19

## 2015-04-17 LAB — POCT WET PREP (WET MOUNT)

## 2015-04-17 NOTE — Patient Instructions (Signed)
Follow up prn

## 2015-04-17 NOTE — Progress Notes (Signed)
Subjective:     Patient ID: Mackenzie Harris, female   DOB: 1990-02-24, 25 y.o.   MRN: [id number]  HPI Mackenzie Harris is a 25 year old white female, in for proof of cure for trich and chlamydia that was positive on pap.   Review of Systems Patient denies any headaches, hearing loss, fatigue, blurred vision, shortness of breath, chest pain, abdominal pain, problems with bowel movements, urination, or intercourse. No joint pain or mood swings. Reviewed past medical,surgical, social and family history. Reviewed medications and allergies.     Objective:   Physical Exam BP 132/72 mmHg  Pulse 72  Ht 5\' 6"  (1.676 m)  Wt 212 lb 8 oz (96.389 kg)  BMI 34.31 kg/m2   Skin warm and dry.Pelvic: external genitalia is normal in appearance no lesions, vagina: white discharge without odor,urethra has no lesions or masses noted, cervix:smooth, uterus: normal size, shape and contour, non tender, no masses felt, adnexa: no masses or tenderness noted. Bladder is non tender and no masses felt. Wet prep: + few WBCs no trich, will send urine to check GC/CHL  Assessment:     History of chlamydia  History of trich    Plan:     GC/CHL sent   Follow up prn

## 2015-04-19 LAB — GC/CHLAMYDIA PROBE AMP
Chlamydia trachomatis, NAA: NEGATIVE
Neisseria gonorrhoeae by PCR: NEGATIVE

## 2015-07-20 ENCOUNTER — Encounter: Payer: Self-pay | Admitting: Adult Health

## 2016-08-28 ENCOUNTER — Ambulatory Visit (INDEPENDENT_AMBULATORY_CARE_PROVIDER_SITE_OTHER): Payer: BLUE CROSS/BLUE SHIELD | Admitting: Adult Health

## 2016-08-28 ENCOUNTER — Encounter: Payer: Self-pay | Admitting: Adult Health

## 2016-08-28 VITALS — BP 120/68 | HR 82 | Ht 65.0 in | Wt 231.5 lb

## 2016-08-28 DIAGNOSIS — Z01419 Encounter for gynecological examination (general) (routine) without abnormal findings: Secondary | ICD-10-CM | POA: Diagnosis not present

## 2016-08-28 DIAGNOSIS — Z113 Encounter for screening for infections with a predominantly sexual mode of transmission: Secondary | ICD-10-CM

## 2016-08-28 MED ORDER — FOLIC ACID 1 MG PO TABS: 1.0000 mg | ORAL_TABLET | Freq: Every day | ORAL | Status: DC

## 2016-08-28 NOTE — Progress Notes (Signed)
Patient ID: Mackenzie Harris, female   DOB: 12-Nov-1990, 26 y.o.   MRN: [id number] History of Present Illness: Mackenzie Harris is a 26 year old white female, single, G0P0, I for well woman gyn exam, had normal pap with negative HPV 03/16/15, and was recently [facility] for [facility].She is not on birth control and is OK if gets pregnant. She requests GC/CHL today, has had HIV and RPR [facility] and is awaiting results.    Current Medications, Allergies, Past Medical History, Past Surgical History, Family History and Social History [facility].     Review of Systems: Patient denies any headaches, hearing loss, fatigue, blurred vision, shortness of breath, chest pain, abdominal pain, problems with bowel movements, urination, or intercourse. No joint pain or mood swings.    Physical Exam:BP 120/68 (BP Location: Left Arm, Patient Position: Sitting, Cuff Size: Large)   Pulse 82   Ht 5\' 5"  (1.651 m)   Wt 231 lb 8 oz (105 kg)   LMP 08/02/2016 (Exact Date)   BMI 38.52 kg/m  General:  Well developed, well nourished, no acute distress Skin:  Warm and dry Neck:  Midline trachea, normal thyroid, good ROM, no lymphadenopathy Lungs; Clear to auscultation bilaterally Breast:  No dominant palpable mass, retraction, or nipple discharge Cardiovascular: Regular rate and rhythm Abdomen:  Soft, non tender, no hepatosplenomegaly Pelvic:  External genitalia is normal in appearance, no lesions.  The vagina is normal in appearance. Urethra has no lesions or masses. The cervix is smooth, GC/CHL obtained.  Uterus is felt to be normal size, shape, and contour.  No adnexal masses or tenderness noted.Bladder is non tender, no masses felt. Extremities/musculoskeletal:  No swelling or varicosities noted, no clubbing or cyanosis Psych:  No mood changes, alert and cooperative,seems happy PHQ 2 score 0.  Impression: 1. Well woman exam with routine  gynecological exam   2. Screening examination for STD (sexually transmitted disease)       Plan:  GC/CHL sent Take folic acid 1 mg daily Physical in 1 year Pap in 2020

## 2016-08-28 NOTE — Patient Instructions (Signed)
Physical in 1 year Take folic acid 1 mg daily

## 2016-08-31 LAB — GC/CHLAMYDIA PROBE AMP
CHLAMYDIA, DNA PROBE: NEGATIVE
NEISSERIA GONORRHOEAE BY PCR: NEGATIVE

## 2016-10-29 ENCOUNTER — Encounter (HOSPITAL_COMMUNITY): Payer: Self-pay | Admitting: *Deleted

## 2016-10-29 ENCOUNTER — Emergency Department (HOSPITAL_COMMUNITY)
Admission: EM | Admit: 2016-10-29 | Discharge: 2016-10-29 | Disposition: A | Discharge status: Home / Self Care | Payer: BLUE CROSS/BLUE SHIELD | Attending: Emergency Medicine | Admitting: Emergency Medicine

## 2016-10-29 DIAGNOSIS — Z79899 Other long term (current) drug therapy: Secondary | ICD-10-CM | POA: Insufficient documentation

## 2016-10-29 DIAGNOSIS — R600 Localized edema: Secondary | ICD-10-CM | POA: Diagnosis present

## 2016-10-29 DIAGNOSIS — L0231 Cutaneous abscess of buttock: Secondary | ICD-10-CM | POA: Insufficient documentation

## 2016-10-29 MED ORDER — IBUPROFEN 600 MG PO TABS: 600.0000 mg | ORAL_TABLET | Freq: Four times a day (QID) | ORAL | 0 refills | Status: DC

## 2016-10-29 MED ORDER — OXYCODONE-ACETAMINOPHEN 5-325 MG PO TABS: 1.0000 | ORAL_TABLET | Freq: Four times a day (QID) | ORAL | 0 refills | Status: DC | PRN

## 2016-10-29 MED ORDER — DOXYCYCLINE HYCLATE 100 MG PO TABS
100.0000 mg | ORAL_TABLET | Freq: Once | ORAL | Status: AC
  Administered 2016-10-29: 100 mg via ORAL
  Filled 2016-10-29: qty 1

## 2016-10-29 MED ORDER — OXYCODONE-ACETAMINOPHEN 5-325 MG PO TABS
1.0000 | ORAL_TABLET | Freq: Once | ORAL | Status: AC
  Administered 2016-10-29: 1 via ORAL
  Filled 2016-10-29: qty 1

## 2016-10-29 MED ORDER — BUPIVACAINE-EPINEPHRINE 0.25% -1:200000 IJ SOLN
20.0000 mL | Freq: Once | INTRAMUSCULAR | Status: DC
  Filled 2016-10-29: qty 20

## 2016-10-29 MED ORDER — POVIDONE-IODINE 10 % EX SOLN
CUTANEOUS | Status: AC
  Administered 2016-10-29: 21:00:00
  Filled 2016-10-29: qty 15

## 2016-10-29 MED ORDER — IBUPROFEN 800 MG PO TABS
800.0000 mg | ORAL_TABLET | Freq: Once | ORAL | Status: AC
  Administered 2016-10-29: 800 mg via ORAL
  Filled 2016-10-29: qty 1

## 2016-10-29 MED ORDER — BUPIVACAINE HCL (PF) 0.25 % IJ SOLN
INTRAMUSCULAR | Status: AC
  Administered 2016-10-29: 21:00:00
  Filled 2016-10-29: qty 30

## 2016-10-29 MED ORDER — PROMETHAZINE HCL 12.5 MG PO TABS
12.5000 mg | ORAL_TABLET | Freq: Once | ORAL | Status: AC
  Administered 2016-10-29: 12.5 mg via ORAL
  Filled 2016-10-29: qty 1

## 2016-10-29 MED ORDER — DOXYCYCLINE HYCLATE 100 MG PO CAPS
100.0000 mg | ORAL_CAPSULE | Freq: Two times a day (BID) | ORAL | 0 refills | Status: DC
Start: 2016-10-29 — End: 2019-10-12

## 2016-10-29 NOTE — ED Triage Notes (Signed)
Pt c/o boil to left buttock that came up 3 days ago. No drainage.

## 2016-10-29 NOTE — Discharge Instructions (Signed)
Please soak in warm Epsom salt water for about 15 minutes daily until the wound heals from the inside out. Please change the dressing daily. Use doxycycline 2 times daily with food. Use ibuprofen with breakfast, lunch, dinner, and at bedtime over the next 5 days. Use Percocet every 6 hours as needed for severe pain. Percocet may cause drowsiness, and/or constipation. Please use a stool softener while taking this medication. Please see your primary physician, or return to the emergency department if any signs of advancing infection, high fever,excessive nausea vomiting, or deterioration in your general condition.

## 2016-10-29 NOTE — ED Provider Notes (Signed)
[facility] EMERGENCY DEPARTMENT Provider Note   CSN: [id number] Arrival date & time: 10/29/16  1752     History   Chief Complaint Chief Complaint  Patient presents with  . Abscess    HPI Mackenzie Harris is a 26 y.o. female.  Mackenzie Harris is a 26 year old female who presents to the emergency department with a complaint of an abscess of the buttocks.  The patient states that she has felt a pimple on the buttocks area for nearly a week, over the last 3 days however it is getting larger and has been causing more and more pain. The patient states that she can no longer take the pain. It hurts her to stand to walk to move. She denies fever or chills. She is not diabetic. She has no medical issues that would interfere with her immune system.      Past Medical History:  Diagnosis Date  . Allergy   . Anxiety   . BV (bacterial vaginosis) 03/16/2015  . Chlamydia   . Contraceptive management 06/02/2013  . GERD (gastroesophageal reflux disease)   . History of abnormal cervical Pap smear 02/28/2015  . History of chlamydia 04/17/2015  . History of trichomoniasis 04/17/2015  . Trichimoniasis   . Vaginal discharge 06/09/2014  . Vaginal itching 06/09/2014  . Vaginal Pap smear, abnormal   . Yeast infection 06/09/2014    Patient Active Problem List   Diagnosis Date Noted  . History of chlamydia 04/17/2015  . History of trichomoniasis 04/17/2015  . BV (bacterial vaginosis) 03/16/2015  . History of abnormal cervical Pap smear 02/28/2015  . Vaginal itching 06/09/2014  . Vaginal discharge 06/09/2014  . Yeast infection 06/09/2014  . Contraceptive management 06/02/2013    Past Surgical History:  Procedure Laterality Date  . FOOT SURGERY      OB History    Gravida Para Term Preterm AB Living   0 0 0 0 0 0   SAB TAB Ectopic Multiple Live Births   0 0 0 0         Home Medications    Prior to Admission medications   Medication Sig Start Date End Date Taking? Authorizing Provider  folic  acid (FOLVITE) 1 MG tablet Take 1 tablet (1 mg total) by mouth daily. 08/28/16   Adline Potter, NP  ibuprofen (ADVIL,MOTRIN) 200 MG tablet Take 800 mg by mouth every 6 (six) hours as needed for moderate pain.    [provider]  metroNIDAZOLE (FLAGYL) 500 MG tablet  08/26/16   [provider]    Family History Family History  Problem Relation Age of Onset  . Diabetes Mother        borderline  . Hypertension Father   . Diabetes Maternal Grandmother   . Hypertension Maternal Grandmother   . Diabetes Maternal Grandfather   . Hypertension Maternal Grandfather   . Alzheimer's disease Paternal Grandfather     Social History Social History  Substance Use Topics  . Smoking status: Never Smoker  . Smokeless tobacco: Never Used  . Alcohol use Yes     Comment: occasional     Allergies   Patient has no known allergies.   Review of Systems Review of Systems  Constitutional: Negative for activity change.       All ROS Neg except as noted in HPI  HENT: Negative for nosebleeds.   Eyes: Negative for photophobia and discharge.  Respiratory: Negative for cough, shortness of breath and wheezing.   Cardiovascular: Negative for chest pain and  palpitations.  Gastrointestinal: Negative for abdominal pain and blood in stool.  Genitourinary: Negative for dysuria, frequency and hematuria.  Musculoskeletal: Negative for arthralgias, back pain and neck pain.  Skin: Negative.        abscess  Neurological: Negative for dizziness, seizures and speech difficulty.  Psychiatric/Behavioral: Negative for confusion and hallucinations.     Physical Exam Updated Vital Signs BP 126/79 (BP Location: Right Arm)   Pulse 84   Temp 98 F (36.7 C) (Oral)   Resp 18   Ht  (1.676 m)   Wt 99.8 kg (220 lb)   LMP 10/24/2016   SpO2 100%   BMI 35.51 kg/m   Physical Exam  Constitutional: She is oriented to person, place, and time. She appears well-developed and well-nourished.   Non-toxic appearance.  HENT:  Head: Normocephalic.  Right Ear: Tympanic membrane and external ear normal.  Left Ear: Tympanic membrane and external ear normal.  Eyes: Pupils are equal, round, and reactive to light. EOM and lids are normal.  Neck: Normal range of motion. Neck supple. Carotid bruit is not present.  Cardiovascular: Normal rate, regular rhythm, normal heart sounds, intact distal pulses and normal pulses.   Pulmonary/Chest: Breath sounds normal. No respiratory distress.  Abdominal: Soft. Bowel sounds are normal. There is no tenderness. There is no guarding.  Genitourinary:  Genitourinary Comments: Chaperone present during the examination. There is a fluctuant abscess with some mild drainage on the inner aspect of the lower left buttocks cheek. The anus is noninvolved. The vagina is noninvolved. There no red streaks appreciated.  Musculoskeletal: Normal range of motion.  Lymphadenopathy:       Head (right side): No submandibular adenopathy present.       Head (left side): No submandibular adenopathy present.    She has no cervical adenopathy.  Neurological: She is alert and oriented to person, place, and time. She has normal strength. No cranial nerve deficit or sensory deficit.  Skin: Skin is warm and dry.  Psychiatric: She has a normal mood and affect. Her speech is normal.  Nursing note and vitals reviewed.    ED Treatments / Results  Labs (all labs ordered are listed, but only abnormal results are displayed) Labs Reviewed  AEROBIC CULTURE (SUPERFICIAL SPECIMEN)    EKG  EKG Interpretation None       Radiology No results found.  Procedures .Marland KitchenIncision and Drainage Date/Time: 10/29/2016 8:57 PM Performed by: Ivery Quale Authorized by: Ivery Quale   Consent:    Consent obtained:  Verbal   Consent given by:  Patient   Risks discussed:  Bleeding, incomplete drainage, infection and pain   Alternatives discussed:  Referral Location:    Type:   Abscess   Location: left buttock. Pre-procedure details:    Skin preparation:  Betadine Anesthesia (see MAR for exact dosages):    Anesthesia method:  Local infiltration   Local anesthetic:  Lidocaine 1% w/o epi and bupivacaine 0.25% w/o epi Procedure type:    Complexity:  Complex Procedure details:    Incision types:  Single straight   Incision depth:  Subcutaneous   Scalpel blade:  11   Wound management:  Probed and deloculated, irrigated with saline and extensive cleaning   Drainage:  Bloody and purulent   Drainage amount:  Copious   Wound treatment:  Wound left open Post-procedure details:    Patient tolerance of procedure:  Tolerated well, no immediate complications   (including critical care time)  Medications Ordered in ED Medications  bupivacaine-EPINEPHrine (  MARCAINE W/ EPI) 0.25% -1:200000 (with pres) injection 20 mL (not administered)  oxyCODONE-acetaminophen (PERCOCET/ROXICET) 5-325 MG per tablet 1 tablet (1 tablet Oral Given 10/29/16 2023)  promethazine (PHENERGAN) tablet 12.5 mg (12.5 mg Oral Given 10/29/16 2023)  doxycycline (VIBRA-TABS) tablet 100 mg (100 mg Oral Given 10/29/16 2023)  ibuprofen (ADVIL,MOTRIN) tablet 800 mg (800 mg Oral Given 10/29/16 2023)  povidone-iodine (BETADINE) 10 % external solution (  Given by Other 10/29/16 2055)  bupivacaine (PF) (MARCAINE) 0.25 % injection (  Given by Other 10/29/16 2056)     Initial Impression / Assessment and Plan / ED Course  I have reviewed the triage vital signs and the nursing notes.  Pertinent labs & imaging results that were available during my care of the patient were reviewed by me and considered in my medical decision making (see chart for details).       Final Clinical Impressions(s) / ED Diagnoses MDM Vital signs within normal limits. Incision and drainage was carried out for abscess of the left lower buttocks. Culture was sent to the lab. Patient started on doxycycline 2 times daily. The patient  will use ibuprofen and Percocet for pain. Patient will use warm tub soaks daily until wound heals. Patient will return to the emergency department or see the primary physician if any changes, problems, or concerns. Patient is in agreement with this plan.   Final diagnoses:  Abscess of left buttock    New Prescriptions New Prescriptions   No medications on file     Ivery Quale, Cordelia Poche 10/29/16 2124    Lavera Guise, MD 10/30/16 [phone]

## 2016-11-01 LAB — AEROBIC CULTURE W GRAM STAIN (SUPERFICIAL SPECIMEN)
Culture: NO GROWTH
Special Requests: NORMAL

## 2016-11-01 LAB — AEROBIC CULTURE  (SUPERFICIAL SPECIMEN)

## 2016-11-07 ENCOUNTER — Ambulatory Visit: Payer: BLUE CROSS/BLUE SHIELD | Admitting: Advanced Practice Midwife

## 2019-02-22 ENCOUNTER — Other Ambulatory Visit: Payer: BLUE CROSS/BLUE SHIELD

## 2019-03-28 ENCOUNTER — Encounter (HOSPITAL_COMMUNITY): Payer: Self-pay | Admitting: Emergency Medicine

## 2019-03-28 ENCOUNTER — Emergency Department (HOSPITAL_COMMUNITY)
Admission: EM | Admit: 2019-03-28 | Discharge: 2019-03-28 | Disposition: A | Discharge status: Home / Self Care | Payer: 59 | Attending: Emergency Medicine | Admitting: Emergency Medicine

## 2019-03-28 ENCOUNTER — Other Ambulatory Visit: Payer: Self-pay

## 2019-03-28 DIAGNOSIS — M545 Low back pain, unspecified: Secondary | ICD-10-CM

## 2019-03-28 DIAGNOSIS — Z79899 Other long term (current) drug therapy: Secondary | ICD-10-CM | POA: Insufficient documentation

## 2019-03-28 LAB — POC URINE PREG, ED: Preg Test, Ur: NEGATIVE

## 2019-03-28 MED ORDER — METHOCARBAMOL 500 MG PO TABS: 500.0000 mg | ORAL_TABLET | Freq: Three times a day (TID) | ORAL | 0 refills | Status: DC | PRN

## 2019-03-28 MED ORDER — NAPROXEN 500 MG PO TABS: 500.0000 mg | ORAL_TABLET | Freq: Two times a day (BID) | ORAL | 0 refills | Status: DC

## 2019-03-28 NOTE — ED Provider Notes (Signed)
[facility] EMERGENCY DEPARTMENT Provider Note   CSN: [id number] Arrival date & time: 03/28/19  1253     History Chief Complaint  Patient presents with  . Back Pain    Mackenzie Harris is a 29 y.o. female with a history of anxiety, GERD, and prior STIs who presents to the emergency department with complaints of lower back pain that began yesterday.  Patient states that yesterday she was having some issues with constipation & lower abdominal discomfort, she took an over-the-counter medication to help with this, she subsequently had a bowel movement that she had to strain for.  She states that this relieved her constipation as well as her abdominal cramping but since then she has had pain to the right lower back.  Pain in the back is constant, worse with movement, no alleviating factors.  Tried taking Tums prior to arrival because he was not sure if this was gas pain. Denies current abdominal pain, nausea, vomiting, numbness, tingling, weakness, saddle anesthesia, incontinence to bowel/bladder, fever, chills, IV drug use, dysuria, hematuria, or hx of cancer. Patient has not had prior back surgeries.    HPI     Past Medical History:  Diagnosis Date  . Allergy   . Anxiety   . BV (bacterial vaginosis) 03/16/2015  . Chlamydia   . Contraceptive management 06/02/2013  . GERD (gastroesophageal reflux disease)   . History of abnormal cervical Pap smear 02/28/2015  . History of chlamydia 04/17/2015  . History of trichomoniasis 04/17/2015  . Trichimoniasis   . Vaginal discharge 06/09/2014  . Vaginal itching 06/09/2014  . Vaginal Pap smear, abnormal   . Yeast infection 06/09/2014    Patient Active Problem List   Diagnosis Date Noted  . History of chlamydia 04/17/2015  . History of trichomoniasis 04/17/2015  . BV (bacterial vaginosis) 03/16/2015  . History of abnormal cervical Pap smear 02/28/2015  . Vaginal itching 06/09/2014  . Vaginal discharge 06/09/2014  . Yeast infection 06/09/2014  .  Contraceptive management 06/02/2013    Past Surgical History:  Procedure Laterality Date  . FOOT SURGERY       OB History    Gravida  0   Para  0   Term  0   Preterm  0   AB  0   Living  0     SAB  0   TAB  0   Ectopic  0   Multiple  0   Live Births              Family History  Problem Relation Age of Onset  . Diabetes Mother        borderline  . Hypertension Father   . Diabetes Maternal Grandmother   . Hypertension Maternal Grandmother   . Diabetes Maternal Grandfather   . Hypertension Maternal Grandfather   . Alzheimer's disease Paternal Grandfather     Social History   Tobacco Use  . Smoking status: Never Smoker  . Smokeless tobacco: Never Used  Substance Use Topics  . Alcohol use: Yes    Comment: occasional  . Drug use: No    Home Medications Prior to Admission medications   Medication Sig Start Date End Date Taking? Authorizing Provider  doxycycline (VIBRAMYCIN) 100 MG capsule Take 1 capsule (100 mg total) by mouth 2 (two) times daily. Take with food 10/29/16   Ivery Quale, PA-C  folic acid (FOLVITE) 1 MG tablet Take 1 tablet (1 mg total) by mouth daily. 08/28/16   Adline Potter, NP  ibuprofen (ADVIL,MOTRIN) 600 MG tablet Take 1 tablet (600 mg total) by mouth 4 (four) times daily. 10/29/16   Ivery Quale, PA-C  metroNIDAZOLE (FLAGYL) 500 MG tablet  08/26/16   [provider]  oxyCODONE-acetaminophen (PERCOCET/ROXICET) 5-325 MG tablet Take 1 tablet by mouth every 6 (six) hours as needed. Take this medication with food 10/29/16   Ivery Quale, PA-C    Allergies    Patient has no known allergies.  Review of Systems   Review of Systems  Constitutional: Negative for chills and fever.  Gastrointestinal: Positive for abdominal pain (Resolved at present.) and constipation (Resolved at present.). Negative for blood in stool, nausea and vomiting.  Genitourinary: Negative for dysuria and hematuria.  Musculoskeletal: Positive  for back pain.  Neurological: Negative for weakness and numbness.       Negative for incontinence or saddle anesthesia.    Physical Exam Updated Vital Signs BP 106/76 (BP Location: Right Arm)   Pulse 79   Temp 98.2 F (36.8 C) (Oral)   Resp 16   Ht 5\' 6"  (1.676 m)   Wt 106.6 kg   LMP 01/28/2019   SpO2 100%   BMI 37.93 kg/m   Physical Exam Vitals and nursing note reviewed.  Constitutional:      General: She is not in acute distress.    Appearance: She is well-developed. She is not toxic-appearing.  HENT:     Head: Normocephalic and atraumatic.  Eyes:     General:        Right eye: No discharge.        Left eye: No discharge.     Conjunctiva/sclera: Conjunctivae normal.  Cardiovascular:     Rate and Rhythm: Normal rate and regular rhythm.  Pulmonary:     Effort: Pulmonary effort is normal. No respiratory distress.     Breath sounds: Normal breath sounds. No wheezing, rhonchi or rales.  Abdominal:     General: There is no distension.     Palpations: Abdomen is soft.     Tenderness: There is no abdominal tenderness. There is no right CVA tenderness, left CVA tenderness, guarding or rebound.  Musculoskeletal:     Cervical back: Normal range of motion and neck supple. No spinous process tenderness or muscular tenderness.     Comments: No obvious deformity, appreciable swelling, erythema, ecchymosis, significant open wounds, or increased warmth.  Extremities: Normal ROM. Nontender.  Back: No point/focal vertebral tenderness, no palpable step off or crepitus. Tender to palpation to the R lower thoracic paraspinal muscles and the R lumbar paraspinal muscles.   Skin:    General: Skin is warm and dry.     Findings: No rash.  Neurological:     Mental Status: She is alert.     Deep Tendon Reflexes:     Reflex Scores:      Patellar reflexes are 2+ on the right side and 2+ on the left side.    Comments: Sensation grossly intact to bilateral lower extremities. 5/5 symmetric  strength with plantar/dorsiflexion bilaterally. Gait is intact without obvious foot drop.   Psychiatric:        Behavior: Behavior normal.     ED Results / Procedures / Treatments   Labs (all labs ordered are listed, but only abnormal results are displayed) Labs Reviewed  URINE CULTURE  URINALYSIS, ROUTINE W REFLEX MICROSCOPIC  POC URINE PREG, ED    EKG None  Radiology No results found.  Procedures Procedures (including critical care time)  Medications Ordered in ED  Medications - No data to display  ED Course  I have reviewed the triage vital signs and the nursing notes.  Pertinent labs & imaging results that were available during my care of the patient were reviewed by me and considered in my medical decision making (see chart for details).    MDM Rules/Calculators/A&P                      Patient presents with complaint of back pain.  Patient is nontoxic appearing, vitals are WNL. No abdominal tenderness or peritoneal signs. Patient has normal neurologic exam, no point/focal midline tenderness to palpation. Ambulatory in the ED.  No back pain red flags. No urinary sxs. Most likely muscle strain versus spasm. Considered UTI/pyelonephritis, kidney stone, aortic aneurysm/dissection, bowel obstruction, appendicitis, cauda equina or epidural abscess however do not feel these diagnoses fit clinical picture at this time. Will treat with Naproxen and Robaxin, discussed with patient that they are not to drive or operate heavy machinery while taking Robaxin. I discussed treatment plan, need for PCP follow-up, and return precautions with the patient. Provided opportunity for questions, patient confirmed understanding and is in agreement with plan.   Final Clinical Impression(s) / ED Diagnoses Final diagnoses:  Acute right-sided low back pain without sciatica    Rx / DC Orders ED Discharge Orders         Ordered    naproxen (NAPROSYN) 500 MG tablet  2 times daily     03/28/19  1325    methocarbamol (ROBAXIN) 500 MG tablet  Every 8 hours PRN     03/28/19 1325           Melany Wiesman, [place] R, PA-C 03/28/19 1326    Veryl Speak, MD 03/28/19 1343

## 2019-03-28 NOTE — Discharge Instructions (Addendum)
You were seen in the emergency department for back pain today.  At this time we suspect that your pain is related to a muscle strain/spasm.   I have prescribed you an anti-inflammatory medication and a muscle relaxer.  - Naproxen is a nonsteroidal anti-inflammatory medication that will help with pain and swelling. Be sure to take this medication as prescribed with food, 1 pill every 12 hours,  It should be taken with food, as it can cause stomach upset, and more seriously, stomach bleeding. Do not take other nonsteroidal anti-inflammatory medications with this such as Advil, Motrin, Aleve, Mobic, Goodie Powder, or Motrin.    - Robaxin is the muscle relaxer I have prescribed, this is meant to help with muscle tightness. Be aware that this medication may make you drowsy therefore the first time you take this it should be at a time you are in an environment where you can rest. Do not drive or operate heavy machinery when taking this medication. Do not drink alcohol or take other sedating medications with this medicine such as narcotics or benzodiazepines.   You make take Tylenol per over the counter dosing with these medications.   We have prescribed you new medication(s) today. Discuss the medications prescribed today with your pharmacist as they can have adverse effects and interactions with your other medicines including over the counter and prescribed medications. Seek medical evaluation if you start to experience new or abnormal symptoms after taking one of these medicines, seek care immediately if you start to experience difficulty breathing, feeling of your throat closing, facial swelling, or rash as these could be indications of a more serious allergic reaction   The application of heat can help soothe the pain.  You will need to follow up with  Your primary healthcare provider in 1-2 weeks for reassessment, if you do not have a primary care provider one is provided in your discharge instructions-  you may see the [facility] or call the provided phone number. However return to the ER should you develop ne or worsening symptoms or any other concerns including but not limited to severe or worsening pain, low back pain with fever, numbness, weakness, loss of bowel or bladder control, or inability to walk or urinate, you should return to the ER immediately.     [facility] Primary Care Doctor List    Kari Baars MD. Specialty: Pulmonary Disease Contact information: [address]  [address]  [place] [address]  [phone]   Syliva Overman, MD. Specialty: [facility] Medicine Contact information: [address]  [place] [address]  [phone]   Lilyan Punt, MD. Specialty: [facility] Medicine Contact information: [address]  [place] [address]  [phone]   Avon Gully, MD Specialty: Internal Medicine Contact information: [address] [place] [address]  [phone]   Catalina Pizza, MD. Specialty: Internal Medicine Contact information: [address]  [place] [address]  [phone]    [facility] (Dr. Selena Batten) Specialty: Family Medicine Contact information: [address]  [place] [address]  [phone]   John Giovanni, MD. Specialty: [facility] Medicine Contact information: [address]  [address]  [place] [address]  [phone]   Carylon Perches, MD. Specialty: Internal Medicine Contact information: [address]  [address]  [place] [address]  [phone]    [facility]  [address] [place], [place] [address] [phone]  Services [facility] offers a variety of basic health services.  Services include but are not limited to: Blood pressure checks  Heart rate checks  Blood sugar checks  Urine analysis  Rapid strep tests  Pregnancy tests.  Health education and referrals  People needing more  complex services will be directed to a physician online. Using these virtual visits, doctors can evaluate and prescribe medicine and treatments. There will be no medication on-site, though [facility] will help patients fill their prescriptions at little to no cost.   For More information please go to: [email or web address]

## 2019-03-28 NOTE — ED Notes (Signed)
Patient reports increased urination frequency.

## 2019-03-28 NOTE — ED Triage Notes (Signed)
Patient c/o Lower right side back pain. Patient was constipated yesterday and took some MOM. Lower back pain since last BM this morning.

## 2019-04-09 ENCOUNTER — Encounter (HOSPITAL_COMMUNITY): Payer: Self-pay | Admitting: *Deleted

## 2019-04-09 ENCOUNTER — Other Ambulatory Visit: Payer: Self-pay

## 2019-04-09 ENCOUNTER — Emergency Department (HOSPITAL_COMMUNITY)
Admission: EM | Admit: 2019-04-09 | Discharge: 2019-04-09 | Disposition: A | Discharge status: Home / Self Care | Payer: 59 | Attending: Emergency Medicine | Admitting: Emergency Medicine

## 2019-04-09 ENCOUNTER — Emergency Department (HOSPITAL_COMMUNITY): Payer: 59

## 2019-04-09 DIAGNOSIS — R102 Pelvic and perineal pain: Secondary | ICD-10-CM | POA: Diagnosis present

## 2019-04-09 DIAGNOSIS — N760 Acute vaginitis: Secondary | ICD-10-CM | POA: Diagnosis not present

## 2019-04-09 DIAGNOSIS — Z79899 Other long term (current) drug therapy: Secondary | ICD-10-CM | POA: Diagnosis not present

## 2019-04-09 DIAGNOSIS — N946 Dysmenorrhea, unspecified: Secondary | ICD-10-CM | POA: Insufficient documentation

## 2019-04-09 DIAGNOSIS — B9689 Other specified bacterial agents as the cause of diseases classified elsewhere: Secondary | ICD-10-CM

## 2019-04-09 HISTORY — DX: Unspecified ovarian cyst, unspecified side: N83.209

## 2019-04-09 LAB — WET PREP, GENITAL
Sperm: NONE SEEN
Trich, Wet Prep: NONE SEEN
WBC, Wet Prep HPF POC: NONE SEEN
Yeast Wet Prep HPF POC: NONE SEEN

## 2019-04-09 LAB — COMPREHENSIVE METABOLIC PANEL
ALT: 11 U/L (ref 0–44)
AST: 14 U/L — ABNORMAL LOW (ref 15–41)
Albumin: 3.7 g/dL (ref 3.5–5.0)
Alkaline Phosphatase: 61 U/L (ref 38–126)
Anion gap: 8 (ref 5–15)
BUN: 13 mg/dL (ref 6–20)
CO2: 27 mmol/L (ref 22–32)
Calcium: 9.2 mg/dL (ref 8.9–10.3)
Chloride: 104 mmol/L (ref 98–111)
Creatinine, Ser: 0.63 mg/dL (ref 0.44–1.00)
GFR calc Af Amer: >60 mL/min (ref >60)
GFR calc non Af Amer: >60 mL/min (ref >60)
Glucose, Bld: 100 mg/dL — ABNORMAL HIGH (ref 70–99)
Potassium: 4.4 mmol/L (ref 3.5–5.1)
Sodium: 139 mmol/L (ref 135–145)
Total Bilirubin: 0.4 mg/dL (ref 0.3–1.2)
Total Protein: 7 g/dL (ref 6.5–8.1)

## 2019-04-09 LAB — URINALYSIS, ROUTINE W REFLEX MICROSCOPIC
Bacteria, UA: NONE SEEN
Bilirubin Urine: NEGATIVE
Glucose, UA: NEGATIVE mg/dL
Ketones, ur: NEGATIVE mg/dL
Leukocytes,Ua: NEGATIVE
Nitrite: NEGATIVE
Protein, ur: NEGATIVE mg/dL
Specific Gravity, Urine: 1.02 (ref 1.005–1.030)
pH: 6 (ref 5.0–8.0)

## 2019-04-09 LAB — CBC WITH DIFFERENTIAL/PLATELET
Abs Immature Granulocytes: 0.05 10*3/uL (ref 0.00–0.07)
Basophils Absolute: 0.1 10*3/uL (ref 0.0–0.1)
Basophils Relative: 0 %
Eosinophils Absolute: 0.1 10*3/uL (ref 0.0–0.5)
Eosinophils Relative: 1 %
HCT: 42.6 % (ref 36.0–46.0)
Hemoglobin: 13.9 g/dL (ref 12.0–15.0)
Immature Granulocytes: 0 %
Lymphocytes Relative: 9 %
Lymphs Abs: 1.2 10*3/uL (ref 0.7–4.0)
MCH: 30.2 pg (ref 26.0–34.0)
MCHC: 32.6 g/dL (ref 30.0–36.0)
MCV: 92.4 fL (ref 80.0–100.0)
Monocytes Absolute: 1 10*3/uL (ref 0.1–1.0)
Monocytes Relative: 7 %
Neutro Abs: 11.1 10*3/uL — ABNORMAL HIGH (ref 1.7–7.7)
Neutrophils Relative %: 83 %
Platelets: 302 10*3/uL (ref 150–400)
RBC: 4.61 MIL/uL (ref 3.87–5.11)
RDW: 13.2 % (ref 11.5–15.5)
WBC: 13.5 10*3/uL — ABNORMAL HIGH (ref 4.0–10.5)
nRBC: 0 % (ref 0.0–0.2)

## 2019-04-09 LAB — LIPASE, BLOOD: Lipase: 23 U/L (ref 11–51)

## 2019-04-09 LAB — POC URINE PREG, ED: Preg Test, Ur: NEGATIVE

## 2019-04-09 MED ORDER — MORPHINE SULFATE (PF) 4 MG/ML IV SOLN
4.0000 mg | Freq: Once | INTRAVENOUS | Status: AC
  Administered 2019-04-09: 4 mg via INTRAVENOUS
  Filled 2019-04-09: qty 1

## 2019-04-09 MED ORDER — METRONIDAZOLE 500 MG PO TABS: 500.0000 mg | ORAL_TABLET | Freq: Two times a day (BID) | ORAL | 0 refills | Status: DC

## 2019-04-09 MED ORDER — TRAMADOL HCL 50 MG PO TABS: 50.0000 mg | ORAL_TABLET | Freq: Four times a day (QID) | ORAL | 0 refills | Status: DC | PRN

## 2019-04-09 MED ORDER — ONDANSETRON HCL 4 MG/2ML IJ SOLN
4.0000 mg | Freq: Once | INTRAMUSCULAR | Status: AC
  Administered 2019-04-09: 4 mg via INTRAVENOUS
  Filled 2019-04-09: qty 2

## 2019-04-09 NOTE — ED Notes (Signed)
Pt was informed that we need a urine sample. Pt states that she is not able to urinate at this time. Pt states maybe if she had water.

## 2019-04-09 NOTE — ED Notes (Signed)
20g IV catheter removed from pt's left antecubital. Catheter tip intact. Sterile gauze and band-aid applied upon removing. Dressing is clean, dry and intact.

## 2019-04-09 NOTE — Progress Notes (Signed)
CSW at bedside to provide patient with list of Primary care doctors in her area who accept [facility] insurance. Patient was grateful and states that prior to her switching insurances she was receiving care from the [facility]. CSW offered assistance with scheduling appointment and patient states that she is able to look over the list and find a provider. CSW concluded visit.   Ineze Serrao Sherryle Lis LCSWA Transitions of Care  Clinical Social Worker  Ph: [phone]

## 2019-04-09 NOTE — ED Notes (Signed)
Pelvic exam set up in room 

## 2019-04-09 NOTE — ED Provider Notes (Signed)
[facility] EMERGENCY DEPARTMENT Provider Note   CSN: [id number] Arrival date & time: 04/09/19  9326     History Chief Complaint  Patient presents with  . Abdominal Pain    Mackenzie Harris is a 29 y.o. female with a history of GERD, anxiety and allergies presenting with lower pelvic pain which has been mild and associated with menstrual cramping (currently day 4) became severe this am and worsened with movement, localizes to her left lower pelvis with radiation into the right lower quadrant.  She denies fevers, vomiting but endorses nausea.  No vaginal discharge.  Has a hx of chlamydia with chronic PID, discovered [place] last fall per [place], stating she had an abscess in her right fallopian tube, treated with abx.  Also reports history of ovarian cysts. Denies any current risk factors for std's. She has taken midol and used a heating pad yesterday for menstrual cramping, today it became severe as she was trying to walk into work.   HPI     Past Medical History:  Diagnosis Date  . Allergy   . Anxiety   . BV (bacterial vaginosis) 03/16/2015  . Chlamydia   . Contraceptive management 06/02/2013  . GERD (gastroesophageal reflux disease)   . History of abnormal cervical Pap smear 02/28/2015  . History of chlamydia 04/17/2015  . History of trichomoniasis 04/17/2015  . Ovarian cyst   . Trichimoniasis   . Vaginal discharge 06/09/2014  . Vaginal itching 06/09/2014  . Vaginal Pap smear, abnormal   . Yeast infection 06/09/2014    Patient Active Problem List   Diagnosis Date Noted  . History of chlamydia 04/17/2015  . History of trichomoniasis 04/17/2015  . BV (bacterial vaginosis) 03/16/2015  . History of abnormal cervical Pap smear 02/28/2015  . Vaginal itching 06/09/2014  . Vaginal discharge 06/09/2014  . Yeast infection 06/09/2014  . Contraceptive management 06/02/2013    Past Surgical History:  Procedure Laterality Date  . FOOT SURGERY       OB History    Gravida  0   Para   0   Term  0   Preterm  0   AB  0   Living  0     SAB  0   TAB  0   Ectopic  0   Multiple  0   Live Births              Family History  Problem Relation Age of Onset  . Diabetes Mother        borderline  . Hypertension Father   . Diabetes Maternal Grandmother   . Hypertension Maternal Grandmother   . Diabetes Maternal Grandfather   . Hypertension Maternal Grandfather   . Alzheimer's disease Paternal Grandfather     Social History   Tobacco Use  . Smoking status: Never Smoker  . Smokeless tobacco: Never Used  Substance Use Topics  . Alcohol use: Not Currently    Comment: occasional  . Drug use: No    Home Medications Prior to Admission medications   Medication Sig Start Date End Date Taking? Authorizing Provider  ibuprofen (ADVIL,MOTRIN) 600 MG tablet Take 1 tablet (600 mg total) by mouth 4 (four) times daily. 10/29/16  Yes Ivery Quale, PA-C  doxycycline (VIBRAMYCIN) 100 MG capsule Take 1 capsule (100 mg total) by mouth 2 (two) times daily. Take with food Patient not taking: Reported on 04/09/2019 10/29/16   Ivery Quale, PA-C  folic acid (FOLVITE) 1 MG tablet Take 1 tablet (  1 mg total) by mouth daily. Patient not taking: Reported on 04/09/2019 08/28/16   Cyril Mourning A, NP  methocarbamol (ROBAXIN) 500 MG tablet Take 1 tablet (500 mg total) by mouth every 8 (eight) hours as needed for muscle spasms. Patient not taking: Reported on 04/09/2019 03/28/19   Petrucelli, Pleas Koch, PA-C  metroNIDAZOLE (FLAGYL) 500 MG tablet Take 1 tablet (500 mg total) by mouth 2 (two) times daily. 04/09/19   Burgess Amor, PA-C  naproxen (NAPROSYN) 500 MG tablet Take 1 tablet (500 mg total) by mouth 2 (two) times daily. Patient not taking: Reported on 04/09/2019 03/28/19   Petrucelli, Pleas Koch, PA-C  oxyCODONE-acetaminophen (PERCOCET/ROXICET) 5-325 MG tablet Take 1 tablet by mouth every 6 (six) hours as needed. Take this medication with food Patient not taking: Reported on  04/09/2019 10/29/16   Ivery Quale, PA-C  traMADol (ULTRAM) 50 MG tablet Take 1 tablet (50 mg total) by mouth every 6 (six) hours as needed. 04/09/19   Burgess Amor, PA-C    Allergies    Patient has no known allergies.  Review of Systems   Review of Systems  Constitutional: Negative for chills and fever.  HENT: Negative for congestion and sore throat.   Eyes: Negative.   Respiratory: Negative for chest tightness and shortness of breath.   Cardiovascular: Negative for chest pain.  Gastrointestinal: Positive for nausea. Negative for abdominal pain and vomiting.  Genitourinary: Positive for pelvic pain and vaginal bleeding. Negative for flank pain and vaginal discharge.  Musculoskeletal: Negative for arthralgias, joint swelling and neck pain.  Skin: Negative.  Negative for rash and wound.  Neurological: Negative for dizziness, weakness, light-headedness, numbness and headaches.  Psychiatric/Behavioral: Negative.     Physical Exam Updated Vital Signs BP 118/78 (BP Location: Left Arm)   Pulse 81   Temp 98.5 F (36.9 C) (Oral)   Resp 16   Ht 5\' 5"  (1.651 m)   Wt 104.3 kg   LMP 04/06/2019   SpO2 100%   BMI 38.27 kg/m   Physical Exam Vitals and nursing note reviewed. Exam conducted with a chaperone present.  Constitutional:      Appearance: She is well-developed.  HENT:     Head: Normocephalic and atraumatic.  Eyes:     Conjunctiva/sclera: Conjunctivae normal.  Cardiovascular:     Rate and Rhythm: Normal rate and regular rhythm.     Heart sounds: Normal heart sounds.  Pulmonary:     Effort: Pulmonary effort is normal.     Breath sounds: Normal breath sounds. No wheezing.  Abdominal:     General: Bowel sounds are normal.     Palpations: Abdomen is soft.     Tenderness: There is abdominal tenderness in the left lower quadrant. There is no guarding or rebound.     Hernia: No hernia is present.  Genitourinary:    Vagina: Normal.     Cervix: Cervical bleeding present.      Uterus: Tender.      Adnexa: Right adnexa normal.       Left: Tenderness present.      Comments: Mild ttp left adnexa and uterus.  Cervix retroverted.   Musculoskeletal:        General: Normal range of motion.     Cervical back: Normal range of motion.  Skin:    General: Skin is warm and dry.  Neurological:     Mental Status: She is alert.     ED Results / Procedures / Treatments   Labs (all labs ordered  are listed, but only abnormal results are displayed) Labs Reviewed  WET PREP, GENITAL - Abnormal; Notable for the following components:      Result Value   Clue Cells Wet Prep HPF POC PRESENT (*)    All other components within normal limits  CBC WITH DIFFERENTIAL/PLATELET - Abnormal; Notable for the following components:   WBC 13.5 (*)    Neutro Abs 11.1 (*)    All other components within normal limits  COMPREHENSIVE METABOLIC PANEL - Abnormal; Notable for the following components:   Glucose, Bld 100 (*)    AST 14 (*)    All other components within normal limits  URINALYSIS, ROUTINE W REFLEX MICROSCOPIC - Abnormal; Notable for the following components:   Hgb urine dipstick MODERATE (*)    All other components within normal limits  LIPASE, BLOOD  URINALYSIS, ROUTINE W REFLEX MICROSCOPIC  POC URINE PREG, ED  GC/CHLAMYDIA PROBE AMP ([place]) NOT AT [facility]    EKG None  Radiology US PELVIC COMPLETE W TRANSVAGINAL AND TORSION R/O  Result Date: 04/09/2019 CLINICAL DATA:  Left pelvic pain EXAM: TRANSABDOMINAL AND TRANSVAGINAL ULTRASOUND OF PELVIS DOPPLER ULTRASOUND OF OVARIES TECHNIQUE: Study was performed transabdominally to optimize pelvic field of view evaluation and transvaginally to optimize internal visceral architecture evaluation. Color and duplex Doppler ultrasound was utilized to evaluate blood flow to the ovaries. COMPARISON:  Pelvic ultrasound December 07, 2018. FINDINGS: Uterus Measurements: 5.7 x 4.4 x 3.6 cm = volume: 46.2 mL. No fibroids or other mass  visualized. Uterus retroverted Endometrium Thickness: 5 mm.  No focal abnormality visualized. Right ovary Measurements: 4.9 x 3.2 x 5.6 cm = volume: 46.5 mL. A cyst arises from the right ovary measuring 3.4 x 2.3 x 2.4 cm. There are several follicles in the right ovary is well. The previously noted tubular appearing structure in the right adnexal region is not appreciable on this study. Left ovary Measurements: 3.4 x 2.5 x 2.5 cm = volume: 10.9 mL. Normal appearance/no adnexal mass. Pulsed Doppler evaluation of both ovaries demonstrates normal low-resistance arterial and venous waveforms. Other findings No abnormal free fluid. IMPRESSION: 1. Apparent simple cyst arising from the right ovary measuring 3.4 x 2.3 x 2.4 cm. No other extrauterine pelvic mass. 2. The apparent small tubo-ovarian abscess noted previously on the right is no longer demonstrable. 3. Low resistance waveforms in each ovary. There are no findings indicative of ovarian torsion. 4. Uterus retroverted. No intrauterine lesions. No endometrial thickening. Electronically Signed   By: Bretta Bang III M.D.   On: 04/09/2019 11:38    Procedures Procedures (including critical care time)  Medications Ordered in ED Medications  morphine 4 MG/ML injection 4 mg (4 mg Intravenous Given 04/09/19 1019)  ondansetron (ZOFRAN) injection 4 mg (4 mg Intravenous Given 04/09/19 1019)    ED Course  I have reviewed the triage vital signs and the nursing notes.  Pertinent labs & imaging results that were available during my care of the patient were reviewed by me and considered in my medical decision making (see chart for details).    MDM Rules/Calculators/A&P                      Imaging and labs reviewed and discussed with patient.  She does have bacterial vaginosis which I suspect is an incidental finding.  However she was placed on Flagyl to treat this condition.  Her abdominal exam is benign with no acute findings on exam, exam consistent with  left adnexal  and uterine tenderness, no right lower quadrant pain, negative McBurney.  Doubt appendicitis.  Ultrasound confirms no torsion and no TOA, prior right TOA appears resolved.  I suspect her symptoms are from dysmenorrhea.  She was encouraged to follow-up with her gynecologist if her symptoms persist.  She was given tramadol, caution regarding sedation.  Also may continue using NSAIDs and heating pad.  Strict return precautions were discussed.  As needed follow-up anticipated. Final Clinical Impression(s) / ED Diagnoses Final diagnoses:  Dysmenorrhea  Bacterial vaginosis    Rx / DC Orders ED Discharge Orders         Ordered    traMADol (ULTRAM) 50 MG tablet  Every 6 hours PRN     04/09/19 1401    metroNIDAZOLE (FLAGYL) 500 MG tablet  2 times daily     04/09/19 1401           Evalee Jefferson, Hershal Coria 04/09/19 1444    Elnora Morrison, MD 04/10/19 1558

## 2019-04-09 NOTE — ED Triage Notes (Signed)
Pt c/o lower abdominal pain that radiates around to lower back that started yesterday. Pt is currently on her menses and reports the pain yesterday felt like her normal menstrual cramps, but this morning it became very intense and she got nauseated. Denies vomiting. Pt took Midol at 0730 this morning, with no relief.

## 2019-04-09 NOTE — Progress Notes (Signed)
Transition of Care [facility]) - Emergency Department Mini Assessment   Patient Details  Name: Mackenzie Harris MRN: [id number] Date of Birth: 12-Jul-1990  [facility]) CM/SW Contact:    Cynai Skeens Sherryle Lis, LCSW Phone Number: 04/09/2019, 10:09 AM   Clinical Narrative:    ED Mini Assessment: What brought you to the Emergency Department? : abdominal pains Barriers to Discharge: Continued Medical Work up [employer] interventions: lab work and pelvic exam. Means of departure: [employer] 1: [employer]   Spoke with: patient Contact Date: 04/09/19,   Contact time: 1008 Contact Phone Number: [phone] Call outcome: CSW provided patient with list of Primary care providers in her area  Patient states their goals for this hospitalization and ongoing recovery are:: to get well and return home   Choice offered to / list presented to : NA  Admission diagnosis:  Abdominal Pain Patient Active Problem List   Diagnosis Date Noted  . History of chlamydia 04/17/2015  . History of trichomoniasis 04/17/2015  . BV (bacterial vaginosis) 03/16/2015  . History of abnormal cervical Pap smear 02/28/2015  . Vaginal itching 06/09/2014  . Vaginal discharge 06/09/2014  . Yeast infection 06/09/2014  . Contraceptive management 06/02/2013   PCP:  Patient, No Pcp Per Pharmacy:   [facility] [address] [place] - [address] [place] [address] Phone: [phone] Fax: [phone]

## 2019-04-09 NOTE — Discharge Instructions (Signed)
Your ultrasound is reassuring today - you do have a small right ovarian cyst but this should not be the source of your pain.  I suspect you are having increased menstrual cramping.  You do have bacterial vaginosis which is being treated with flagyl - take the entire course.  This is NOT an std  - your partner does not need to be treated for this condition.  You may take the tramadol prescribed for menstrual pain relief.  This will make you drowsy - do not drive within 4 hours of taking this medication.

## 2019-04-09 NOTE — Progress Notes (Signed)
CSW attempted to contact patient on room phone to discuss the matter of having a primary care doctor. CSW did not get an answer. CSW headed down to the ED to discuss this matter at bedside.   Patient is noted to have [employer]. CSW will provide patient list of primary care providers in her area  Deward Sebek Tomma Rakers Transitions of Care  Clinical Social Worker  Ph: [phone]

## 2019-04-10 LAB — GC/CHLAMYDIA PROBE AMP (~~LOC~~) NOT AT ARMC
Chlamydia: NEGATIVE
Neisseria Gonorrhea: NEGATIVE

## 2019-10-09 ENCOUNTER — Emergency Department (HOSPITAL_COMMUNITY): Payer: 59

## 2019-10-09 ENCOUNTER — Inpatient Hospital Stay (HOSPITAL_COMMUNITY)
Admission: EM | Admit: 2019-10-09 | Discharge: 2019-10-12 | DRG: 758 | Disposition: A | Discharge status: Home / Self Care | Payer: 59 | Attending: Obstetrics and Gynecology | Admitting: Obstetrics and Gynecology

## 2019-10-09 ENCOUNTER — Encounter (HOSPITAL_COMMUNITY): Payer: Self-pay | Admitting: Emergency Medicine

## 2019-10-09 ENCOUNTER — Other Ambulatory Visit: Payer: Self-pay

## 2019-10-09 DIAGNOSIS — N7093 Salpingitis and oophoritis, unspecified: Secondary | ICD-10-CM

## 2019-10-09 DIAGNOSIS — Z20822 Contact with and (suspected) exposure to covid-19: Secondary | ICD-10-CM | POA: Diagnosis present

## 2019-10-09 DIAGNOSIS — N83512 Torsion of left ovary and ovarian pedicle: Secondary | ICD-10-CM | POA: Diagnosis not present

## 2019-10-09 DIAGNOSIS — R109 Unspecified abdominal pain: Secondary | ICD-10-CM | POA: Diagnosis not present

## 2019-10-09 DIAGNOSIS — R102 Pelvic and perineal pain: Secondary | ICD-10-CM

## 2019-10-09 HISTORY — DX: Salpingitis and oophoritis, unspecified: N70.93

## 2019-10-09 LAB — CBC WITH DIFFERENTIAL/PLATELET
Abs Immature Granulocytes: 0.04 10*3/uL (ref 0.00–0.07)
Basophils Absolute: 0.1 10*3/uL (ref 0.0–0.1)
Basophils Relative: 0 %
Eosinophils Absolute: 0.1 10*3/uL (ref 0.0–0.5)
Eosinophils Relative: 1 %
HCT: 42 % (ref 36.0–46.0)
Hemoglobin: 13.8 g/dL (ref 12.0–15.0)
Immature Granulocytes: 0 %
Lymphocytes Relative: 13 %
Lymphs Abs: 1.8 10*3/uL (ref 0.7–4.0)
MCH: 29.9 pg (ref 26.0–34.0)
MCHC: 32.9 g/dL (ref 30.0–36.0)
MCV: 90.9 fL (ref 80.0–100.0)
Monocytes Absolute: 1.6 10*3/uL — ABNORMAL HIGH (ref 0.1–1.0)
Monocytes Relative: 12 %
Neutro Abs: 10.4 10*3/uL — ABNORMAL HIGH (ref 1.7–7.7)
Neutrophils Relative %: 74 %
Platelets: 273 10*3/uL (ref 150–400)
RBC: 4.62 MIL/uL (ref 3.87–5.11)
RDW: 13.5 % (ref 11.5–15.5)
WBC: 14 10*3/uL — ABNORMAL HIGH (ref 4.0–10.5)
nRBC: 0 % (ref 0.0–0.2)

## 2019-10-09 LAB — COMPREHENSIVE METABOLIC PANEL
ALT: 14 U/L (ref 0–44)
AST: 13 U/L — ABNORMAL LOW (ref 15–41)
Albumin: 3.7 g/dL (ref 3.5–5.0)
Alkaline Phosphatase: 58 U/L (ref 38–126)
Anion gap: 11 (ref 5–15)
BUN: 8 mg/dL (ref 6–20)
CO2: 23 mmol/L (ref 22–32)
Calcium: 9.1 mg/dL (ref 8.9–10.3)
Chloride: 100 mmol/L (ref 98–111)
Creatinine, Ser: 0.63 mg/dL (ref 0.44–1.00)
GFR calc Af Amer: >60 mL/min (ref >60)
GFR calc non Af Amer: >60 mL/min (ref >60)
Glucose, Bld: 105 mg/dL — ABNORMAL HIGH (ref 70–99)
Potassium: 3.6 mmol/L (ref 3.5–5.1)
Sodium: 134 mmol/L — ABNORMAL LOW (ref 135–145)
Total Bilirubin: 1.1 mg/dL (ref 0.3–1.2)
Total Protein: 7.4 g/dL (ref 6.5–8.1)

## 2019-10-09 LAB — WET PREP, GENITAL
Clue Cells Wet Prep HPF POC: NONE SEEN
Sperm: NONE SEEN
Trich, Wet Prep: NONE SEEN
Yeast Wet Prep HPF POC: NONE SEEN

## 2019-10-09 LAB — URINALYSIS, ROUTINE W REFLEX MICROSCOPIC
Glucose, UA: NEGATIVE mg/dL
Hgb urine dipstick: NEGATIVE
Ketones, ur: NEGATIVE mg/dL
Nitrite: NEGATIVE
Protein, ur: NEGATIVE mg/dL
Specific Gravity, Urine: 1.025 (ref 1.005–1.030)
pH: 6 (ref 5.0–8.0)

## 2019-10-09 LAB — PREGNANCY, URINE: Preg Test, Ur: NEGATIVE

## 2019-10-09 LAB — RESPIRATORY PANEL BY RT PCR (FLU A&B, COVID)
Influenza A by PCR: NEGATIVE
Influenza B by PCR: NEGATIVE
SARS Coronavirus 2 by RT PCR: NEGATIVE

## 2019-10-09 LAB — I-STAT BETA HCG BLOOD, ED (MC, WL, AP ONLY): I-stat hCG, quantitative: <5 m[IU]/mL (ref <5)

## 2019-10-09 MED ORDER — KETOROLAC TROMETHAMINE 30 MG/ML IJ SOLN
15.0000 mg | Freq: Four times a day (QID) | INTRAMUSCULAR | Status: DC
  Administered 2019-10-09 – 2019-10-11 (×6): 15 mg via INTRAVENOUS
  Filled 2019-10-09 (×6): qty 1

## 2019-10-09 MED ORDER — ACETAMINOPHEN 325 MG PO TABS: 650.0000 mg | ORAL_TABLET | ORAL | Status: DC | PRN

## 2019-10-09 MED ORDER — SODIUM CHLORIDE 0.9 % IV SOLN: INTRAVENOUS | Status: DC

## 2019-10-09 MED ORDER — LIDOCAINE HCL (PF) 1 % IJ SOLN
INTRAMUSCULAR | Status: AC
  Filled 2019-10-09: qty 30

## 2019-10-09 MED ORDER — METRONIDAZOLE IN NACL 5-0.79 MG/ML-% IV SOLN
500.0000 mg | Freq: Three times a day (TID) | INTRAVENOUS | Status: DC
  Administered 2019-10-09 – 2019-10-11 (×4): 500 mg via INTRAVENOUS
  Filled 2019-10-09 (×6): qty 100

## 2019-10-09 MED ORDER — PIPERACILLIN-TAZOBACTAM 3.375 G IVPB 30 MIN
3.3750 g | Freq: Once | INTRAVENOUS | Status: AC
  Administered 2019-10-09: 3.375 g via INTRAVENOUS
  Filled 2019-10-09: qty 50

## 2019-10-09 MED ORDER — IOHEXOL 300 MG/ML  SOLN
100.0000 mL | Freq: Once | INTRAMUSCULAR | Status: AC | PRN
  Administered 2019-10-09: 100 mL via INTRAVENOUS

## 2019-10-09 MED ORDER — METRONIDAZOLE IN NACL 5-0.79 MG/ML-% IV SOLN
500.0000 mg | Freq: Once | INTRAVENOUS | Status: AC
  Administered 2019-10-09: 500 mg via INTRAVENOUS
  Filled 2019-10-09: qty 100

## 2019-10-09 MED ORDER — PRENATAL MULTIVITAMIN CH
1.0000 | ORAL_TABLET | Freq: Every day | ORAL | Status: DC
  Administered 2019-10-10: 1 via ORAL
  Filled 2019-10-09: qty 1

## 2019-10-09 MED ORDER — CEFTRIAXONE SODIUM 500 MG IJ SOLR
500.0000 mg | Freq: Once | INTRAMUSCULAR | Status: AC
  Administered 2019-10-09: 500 mg via INTRAMUSCULAR
  Filled 2019-10-09: qty 500

## 2019-10-09 MED ORDER — HYDROCODONE-ACETAMINOPHEN 5-325 MG PO TABS
1.0000 | ORAL_TABLET | ORAL | Status: DC | PRN
  Administered 2019-10-09 – 2019-10-10 (×2): 1 via ORAL
  Filled 2019-10-09 (×2): qty 1

## 2019-10-09 MED ORDER — ZOLPIDEM TARTRATE 5 MG PO TABS: 5.0000 mg | ORAL_TABLET | Freq: Every evening | ORAL | Status: DC | PRN

## 2019-10-09 MED ORDER — METRONIDAZOLE 500 MG PO TABS
500.0000 mg | ORAL_TABLET | Freq: Once | ORAL | Status: AC
  Administered 2019-10-09: 500 mg via ORAL
  Filled 2019-10-09: qty 1

## 2019-10-09 MED ORDER — IBUPROFEN 400 MG PO TABS
600.0000 mg | ORAL_TABLET | Freq: Once | ORAL | Status: AC
  Administered 2019-10-09: 600 mg via ORAL
  Filled 2019-10-09: qty 2

## 2019-10-09 MED ORDER — DOXYCYCLINE HYCLATE 100 MG PO TABS
100.0000 mg | ORAL_TABLET | Freq: Once | ORAL | Status: AC
  Administered 2019-10-09: 100 mg via ORAL
  Filled 2019-10-09: qty 1

## 2019-10-09 NOTE — ED Provider Notes (Signed)
Medical screening examination/treatment/procedure(s) were conducted as a shared visit with non-physician practitioner(s) and myself.  I personally evaluated the patient during the encounter.  Clinical Impression:   Final diagnoses:  Pelvic pain  Tubo-Ovarian Abscess  Pt with hx of TOA in past - total resolutio - now with pain in the SP and RLQ that migrated from the umbilical area - now with some pressure with urination - but has focal ttp on my exam in the RLQ - though not guarding - non peritoneal.  VS normal without fever or tachycarida -   R/o appy UA Otherwise well appearing  Still having pain - [place] ordered after discussion with Dr. Emelda Fear - there is presence of cystic structures and inflammation in the pelvis - likely TOA's, r/o torsion, will f/u with Dr. Emelda Fear after [place] completed.   Eber Hong, MD 10/10/19 [phone]

## 2019-10-09 NOTE — ED Triage Notes (Signed)
Pt c/o of lower abdominal pain that radiates to lower back since Thursday. LBM Thursday. Denies any urinary s/s.

## 2019-10-09 NOTE — Progress Notes (Signed)
Page to L. Mayford Knife, MD to make aware of pt's arrival.

## 2019-10-09 NOTE — ED Provider Notes (Signed)
[facility] EMERGENCY DEPARTMENT Provider Note   CSN: [id number] Arrival date & time: 10/09/19  0827     History Chief Complaint  Patient presents with  . Abdominal Pain    Mackenzie Harris is a 29 y.o. female's with 3 days of worsening belly pain.  She reports that on Wednesday she had 6 or 7 loose nonbloody bowel movements, denies dark tarry stool.  She states that on Thursday she had persistent cramping pain across her lower abdomen and and felt the need to have a bowel movement, however was not able to.  On Friday morning she reports she drank 1 bottle of milk of magnesia in an effort to induce a bowel movement, but shortly after vomited it back up.  She subsequently attempted over-the-counter enema at home, with one small soft bowel movement resulting.  She states that yesterday her pain was primarily around her bellybutton, but now is worse across her lower abdomen primarily on the right side.  She reports that she woke early this morning with 10/10 pain; she utilized a muscle relaxer and naproxen that she had at home with only mild improvement of her pain.  When it did not resolve, she decided it was time for her to come to the emergency department for evaluation.  The time of my exam her pain is rated a 7/10. She denies fevers or chills, nausea; denies vomiting aside from single episode yesterday after drinking milk of magnesia.   I personally reviewed patient's medical record and found that she has history of dysmenorrhea, BV and STD infection, and tubo-ovarian abscess earlier this year. She reports that she is not having any pelvic pain, vaginal pain, vaginal discharge, or dyspareunia at this time.  She does endorse some pressure with urination, denies hematuria. She reports she is not using any type of contraception, has been having unprotected intercourse with her female partner for 3 years achieving pregnancy.  Last menstrual was in 08/2019.  P0.  She has not been vaccinated against  COVID-19.  She denies any recent sick contacts.   HPI     Past Medical History:  Diagnosis Date  . Allergy   . Anxiety   . BV (bacterial vaginosis) 03/16/2015  . Chlamydia   . Contraceptive management 06/02/2013  . GERD (gastroesophageal reflux disease)   . History of abnormal cervical Pap smear 02/28/2015  . History of chlamydia 04/17/2015  . History of trichomoniasis 04/17/2015  . Ovarian cyst   . Trichimoniasis   . Vaginal discharge 06/09/2014  . Vaginal itching 06/09/2014  . Vaginal Pap smear, abnormal   . Yeast infection 06/09/2014    Patient Active Problem List   Diagnosis Date Noted  . History of chlamydia 04/17/2015  . History of trichomoniasis 04/17/2015  . BV (bacterial vaginosis) 03/16/2015  . History of abnormal cervical Pap smear 02/28/2015  . Vaginal itching 06/09/2014  . Vaginal discharge 06/09/2014  . Yeast infection 06/09/2014  . Contraceptive management 06/02/2013    Past Surgical History:  Procedure Laterality Date  . FOOT SURGERY       OB History    Gravida  0   Para  0   Term  0   Preterm  0   AB  0   Living  0     SAB  0   TAB  0   Ectopic  0   Multiple  0   Live Births              Family History  Problem Relation Age of Onset  . Diabetes Mother        borderline  . Hypertension Father   . Diabetes Maternal Grandmother   . Hypertension Maternal Grandmother   . Diabetes Maternal Grandfather   . Hypertension Maternal Grandfather   . Alzheimer's disease Paternal Grandfather     Social History   Tobacco Use  . Smoking status: Never Smoker  . Smokeless tobacco: Never Used  Vaping Use  . Vaping Use: Never used  Substance Use Topics  . Alcohol use: Not Currently    Comment: occasional  . Drug use: No    Home Medications Prior to Admission medications   Medication Sig Start Date End Date Taking? Authorizing Provider  hydroxypropyl methylcellulose / hypromellose (ISOPTO TEARS / GONIOVISC) 2.5 % ophthalmic solution  Place 1 drop into both eyes daily as needed for dry eyes.   Yes [provider]  ibuprofen (ADVIL,MOTRIN) 600 MG tablet Take 1 tablet (600 mg total) by mouth 4 (four) times daily. 10/29/16  Yes Ivery Quale, PA-C  naproxen (NAPROSYN) 500 MG tablet Take 1 tablet (500 mg total) by mouth 2 (two) times daily. 03/28/19  Yes Petrucelli, Samantha R, PA-C  doxycycline (VIBRAMYCIN) 100 MG capsule Take 1 capsule (100 mg total) by mouth 2 (two) times daily. Take with food 10/29/16   Ivery Quale, PA-C  folic acid (FOLVITE) 1 MG tablet Take 1 tablet (1 mg total) by mouth daily. Patient not taking: Reported on 04/09/2019 08/28/16   Cyril Mourning A, NP  methocarbamol (ROBAXIN) 500 MG tablet Take 1 tablet (500 mg total) by mouth every 8 (eight) hours as needed for muscle spasms. Patient not taking: Reported on 04/09/2019 03/28/19   Petrucelli, Pleas Koch, PA-C  metroNIDAZOLE (FLAGYL) 500 MG tablet Take 1 tablet (500 mg total) by mouth 2 (two) times daily. Patient not taking: Reported on 10/09/2019 04/09/19   Burgess Amor, PA-C  oxyCODONE-acetaminophen (PERCOCET/ROXICET) 5-325 MG tablet Take 1 tablet by mouth every 6 (six) hours as needed. Take this medication with food Patient not taking: Reported on 04/09/2019 10/29/16   Ivery Quale, PA-C  traMADol (ULTRAM) 50 MG tablet Take 1 tablet (50 mg total) by mouth every 6 (six) hours as needed. Patient not taking: Reported on 10/09/2019 04/09/19   Burgess Amor, PA-C    Allergies    Patient has no known allergies.  Review of Systems   Review of Systems  Constitutional: Negative for chills and fever.  HENT: Negative.   Respiratory: Negative for cough, chest tightness and shortness of breath.   Cardiovascular: Negative for chest pain and palpitations.  Gastrointestinal: Positive for abdominal pain, diarrhea and nausea.  Genitourinary: Positive for dysuria, menstrual problem and urgency. Negative for dyspareunia, pelvic pain, vaginal bleeding, vaginal  discharge and vaginal pain.  Musculoskeletal: Negative.   Skin: Negative.   Neurological: Negative.     Physical Exam Updated Vital Signs BP 99/66 (BP Location: Left Arm)   Pulse 89   Temp 98.5 F (36.9 C)   Resp 16   Ht 5\' 5"  (1.651 m)   Wt 108.9 kg   SpO2 98%   BMI 39.94 kg/m   Physical Exam Vitals and nursing note reviewed. Exam conducted with a chaperone present.  HENT:     Head: Normocephalic and atraumatic.     Mouth/Throat:     Mouth: Mucous membranes are moist.     Pharynx: No oropharyngeal exudate or posterior oropharyngeal erythema.  Eyes:     General:  Right eye: No discharge.        Left eye: No discharge.     Conjunctiva/sclera: Conjunctivae normal.  Cardiovascular:     Rate and Rhythm: Normal rate and regular rhythm.     Pulses: Normal pulses.     Heart sounds: Normal heart sounds. No murmur heard.   Pulmonary:     Effort: Pulmonary effort is normal. No respiratory distress.     Breath sounds: Normal breath sounds. No wheezing or rales.  Abdominal:     General: Bowel sounds are normal. There is no distension.     Palpations: Abdomen is soft.     Tenderness: There is abdominal tenderness in the right lower quadrant, periumbilical area and suprapubic area. There is no guarding or rebound. Positive signs include Rovsing's sign, McBurney's sign and obturator sign. Negative signs include Murphy's sign and psoas sign.  Genitourinary:    General: Normal vulva.     Exam position: Supine.     Vagina: Vaginal discharge and tenderness present.     Cervix: Cervical motion tenderness present.     Adnexa:        Right: Tenderness and fullness present.        Left: No tenderness or fullness.       Comments: Milky white discharge in vaginal vault and cervix Musculoskeletal:        General: No deformity.     Cervical back: Neck supple.  Skin:    General: Skin is warm and dry.     Capillary Refill: Capillary refill takes less than 2 seconds.  Neurological:       General: No focal deficit present.     Mental Status: She is alert. Mental status is at baseline.  Psychiatric:        Mood and Affect: Mood normal.        Behavior: Behavior is cooperative.     ED Results / Procedures / Treatments   Labs (all labs ordered are listed, but only abnormal results are displayed) Labs Reviewed  WET PREP, GENITAL - Abnormal; Notable for the following components:      Result Value   WBC, Wet Prep HPF POC MANY (*)    All other components within normal limits  URINALYSIS, ROUTINE W REFLEX MICROSCOPIC - Abnormal; Notable for the following components:   APPearance HAZY (*)    Bilirubin Urine SMALL (*)    Leukocytes,Ua SMALL (*)    Bacteria, UA RARE (*)    All other components within normal limits  CBC WITH DIFFERENTIAL/PLATELET - Abnormal; Notable for the following components:   WBC 14.0 (*)    Neutro Abs 10.4 (*)    Monocytes Absolute 1.6 (*)    All other components within normal limits  COMPREHENSIVE METABOLIC PANEL - Abnormal; Notable for the following components:   Sodium 134 (*)    Glucose, Bld 105 (*)    AST 13 (*)    All other components within normal limits  RESPIRATORY PANEL BY RT PCR (FLU A&B, COVID)  PREGNANCY, URINE  I-STAT BETA HCG BLOOD, ED ([facility], [facility], [facility] ONLY)  GC/CHLAMYDIA PROBE AMP ([place]) NOT AT [facility]    EKG None  Radiology CT Abdomen Pelvis W Contrast  Result Date: 10/09/2019 CLINICAL DATA:  29 year old female with acute RIGHT abdominal and pelvic pain. EXAM: CT ABDOMEN AND PELVIS WITH CONTRAST TECHNIQUE: Multidetector CT imaging of the abdomen and pelvis was performed using the standard protocol following bolus administration of intravenous contrast. CONTRAST:  OMNIPAQUE IOHEXOL  300 MG/ML  SOLN COMPARISON:  None. FINDINGS: Lower chest: No acute abnormality. A calcified granuloma in the LEFT LOWER lobe is present. Hepatobiliary: The liver and gallbladder are unremarkable. No biliary dilatation. Pancreas: Unremarkable  Spleen: Unremarkable Adrenals/Urinary Tract: The kidneys, adrenal glands and bladder are unremarkable. Stomach/Bowel: Stomach is within normal limits. Appendix appears normal. No evidence of bowel wall thickening, distention, or inflammatory changes. Vascular/Lymphatic: No significant vascular findings are present. No enlarged abdominal or pelvic lymph nodes. Reproductive: A 5.5 cm thick-walled cystic structure in the LEFT adnexal region is noted. A tubular structure in the RIGHT adnexal region is noted. Stranding/inflammation within the pelvis is present. The uterus is grossly unremarkable. Other: A trace amount of free fluid in the pelvis is noted. No evidence of pneumoperitoneum. Musculoskeletal: No acute or suspicious bony abnormalities noted. IMPRESSION: 1. 5.5 cm thick-walled cystic structure in the LEFT adnexal region and tubular structure within the RIGHT adnexal region with adjacent stranding/inflammation within the pelvis. The LEFT adnexal abnormality is nonspecific and may represent ovarian torsion, complex or complicated cyst or abscess. The RIGHT adnexal abnormality probably represents a dilated fallopian tube with possible infection/abscess. Further evaluation with pelvic ultrasound is recommended. 2. No other significant abnormalities. Electronically Signed   By: Harmon PierJeffrey  Hu M.D.   On: 10/09/2019 11:55   US PELVIC COMPLETE W TRANSVAGINAL AND TORSION R/O  Result Date: 10/09/2019 CLINICAL DATA:  Evaluate for possible ovarian torsion. EXAM: TRANSABDOMINAL AND TRANSVAGINAL ULTRASOUND OF PELVIS DOPPLER ULTRASOUND OF OVARIES TECHNIQUE: Both transabdominal and transvaginal ultrasound examinations of the pelvis were performed. Transabdominal technique was performed for global imaging of the pelvis including uterus, ovaries, adnexal regions, and pelvic cul-de-sac. It was necessary to proceed with endovaginal exam following the transabdominal exam to visualize the adnexal structures. Color and duplex  Doppler ultrasound was utilized to evaluate blood flow to the ovaries. COMPARISON:  CT abdomen pelvis October 09, 2019 FINDINGS: Uterus Measurements: 6.0 x 3.3 x 3.8 cm = volume: 39 mL. No fibroids or other mass visualized. Endometrium Thickness: 10 mm.  No focal abnormality visualized. Right ovary Measurements: 4.6 x 2.9 x 3.3 cm = volume: 23 mL. Within the right adnexa adjacent to the right ovary there is a complex tubular structure most compatible with flow can 2 and complex fluid. Left ovary Measurements: 6.0 x 4.0 x 5.3 cm = volume: 65 mL. Heterogeneous in echogenicity with multiple internal cystic structures. Small amount of surrounding fluid. Pulsed Doppler evaluation of the right ovary demonstrates normal low-resistance arterial and venous waveforms. Only peripheral flow is demonstrated about the left ovary. Other findings Small amount of free fluid in the pelvis. IMPRESSION: 1. The left ovary is enlarged and heterogeneous in echogenicity without internal color flow. Given the overall findings within the pelvis and clinical presentation, this is favored to represent left adnexal tubo-ovarian abscess. Additional considerations for the appearance of the left ovary would be torsion of the left ovary or potentially left ovarian hemorrhagic cyst. 2. Findings most compatible with right adnexal pyosalpinx. 3. These results were called by telephone at the time of interpretation on 10/09/2019 at 3:21 pm to provider Rubin PayorPickering, who verbally acknowledged these results. Electronically Signed   By: Annia Beltrew  Davis M.D.   On: 10/09/2019 15:24    Procedures Procedures (including critical care time)  Medications Ordered in ED Medications  piperacillin-tazobactam (ZOSYN) IVPB 3.375 g (3.375 g Intravenous New Bag/Given 10/09/19 1559)  metroNIDAZOLE (FLAGYL) IVPB 500 mg (500 mg Intravenous New Bag/Given 10/09/19 1603)  iohexol (OMNIPAQUE) 300 MG/ML solution 100  mL (100 mLs Intravenous Contrast Given 10/09/19 1110)    doxycycline (VIBRA-TABS) tablet 100 mg (100 mg Oral Given 10/09/19 1253)  metroNIDAZOLE (FLAGYL) tablet 500 mg (500 mg Oral Given 10/09/19 1253)  cefTRIAXone (ROCEPHIN) injection 500 mg (500 mg Intramuscular Given 10/09/19 1253)  lidocaine (PF) (XYLOCAINE) 1 % injection (  Given 10/09/19 1310)  ibuprofen (ADVIL) tablet 600 mg (600 mg Oral Given 10/09/19 1325)    ED Course  I have reviewed the triage vital signs and the nursing notes.  Pertinent labs & imaging results that were available during my care of the patient were reviewed by me and considered in my medical decision making (see chart for details).    MDM Rules/Calculators/A&P                           Differential diagnosis of lower abdominal in this female patient includes but is not limited to: pelvic inflammatory disease, ectopic pregnancy, primary dysmenorrhea, septic abortion, ruptured ovarian cyst or tumor, ovarian torsion, tubo-ovarian abscess, degeneration of fibroid, endometriosis. Non gynecologic etiologies include cystitis, appendicitis, urinary calculi, diverticulitis, ileitis, colitis.    Patient with history of TOA earlier this year, total resolution documented with pelvic ultrasound following completion of antibiotic therapy.  She has followed regularly with her gynecologist in some time.  Patient mildly hypertensive on intake with BP 142/85, afebrile.  Time of my exam patient with regular heart rate of 86, SPO2 97% on room air.  Physical exam revealed exquisite tenderness on the right lower quadrant, positive Rovsing sign, positive obturator sign concerning for possible acute appendicitis versus recurrent TOA on the right.    CBC with leukocytosis of 14 neutrophils elevated to 10.4.  CMP, unremarkable. Urine pregnancy negative, UA with small leukocytes, rare bacteria, + mucus  CT scan concerning for: Nonspecific 5.5 cm thick-walled cystic structure in the left adnexa, concerning for ovarian torsion versus complex  cystic structures/abscess.  Additional of a tubular structure within the right adnexa with adjacent stranding/inflammation in the pelvis, likely representing a dilated fallopian tube with possible infection/abscess.  Pelvic ultrasound for follow-up recommended.  Benign appendix.  Above exam performed, with finding of thick milky white discharge, mild cervical motion tenderness, and right adnexal fullness and tenderness.  Gynecology consulted with recommendation to perform pelvic ultrasound, and to begin antibiotic therapy with Rocephin IM, doxycycline and metronidazole while in the emergency department.  Given patient's history of recent TOA (03/2019), concern is high for recurrent infection.  Pending ultrasound results may need to transfer over to [facility] in [place].   Pelvic US concerning for Left adnexal TOA with decreased ovarian blood flow, as well as Right adnexal pyosalpinx.    Gynecologist Dr. Emelda Fear consulted.  With diagnosed with bilateral tubo-ovarian abscess, he recommends IV antibiotics with Zosyn and Flagyl and transfer to [facility] hospital in [place] for admission, admitting physician Dr. Raynelle Dick.  We will start antibiotic therapy here in [facility] ED while waiting for transfer.  I personally spoke with Dr. Mayford Knife who agreed to admit the patient to [facility].  I attempted to contact the patient's outpatient gynecologist, [facility] in [place].  I was unable to contact the on-call physician despite multiple attempts.  We will proceed with transfer to [facility] for inpatient management of bilateral TOA.  Covid swab pending.  Zoelle voiced understanding of the results of her tests thus far, and understands need for admission to the hospital.  Each of  her questions were answered to expressed satisfaction.  Final Clinical Impression(s) / ED Diagnoses Final diagnoses:  Pelvic pain    Rx / DC Orders ED Discharge Orders    None        Sherrilee Gilles 10/09/19 1607    Eber Hong, MD 10/10/19 [phone]

## 2019-10-10 LAB — COMPREHENSIVE METABOLIC PANEL
ALT: 10 U/L (ref 0–44)
AST: 11 U/L — ABNORMAL LOW (ref 15–41)
Albumin: 2.9 g/dL — ABNORMAL LOW (ref 3.5–5.0)
Alkaline Phosphatase: 49 U/L (ref 38–126)
Anion gap: 7 (ref 5–15)
BUN: 8 mg/dL (ref 6–20)
CO2: 25 mmol/L (ref 22–32)
Calcium: 8.4 mg/dL — ABNORMAL LOW (ref 8.9–10.3)
Chloride: 105 mmol/L (ref 98–111)
Creatinine, Ser: 0.82 mg/dL (ref 0.44–1.00)
GFR calc Af Amer: >60 mL/min (ref >60)
GFR calc non Af Amer: >60 mL/min (ref >60)
Glucose, Bld: 94 mg/dL (ref 70–99)
Potassium: 4.2 mmol/L (ref 3.5–5.1)
Sodium: 137 mmol/L (ref 135–145)
Total Bilirubin: 0.8 mg/dL (ref 0.3–1.2)
Total Protein: 5.9 g/dL — ABNORMAL LOW (ref 6.5–8.1)

## 2019-10-10 LAB — CBC
HCT: 36 % (ref 36.0–46.0)
Hemoglobin: 11.6 g/dL — ABNORMAL LOW (ref 12.0–15.0)
MCH: 29.1 pg (ref 26.0–34.0)
MCHC: 32.2 g/dL (ref 30.0–36.0)
MCV: 90.2 fL (ref 80.0–100.0)
Platelets: 238 10*3/uL (ref 150–400)
RBC: 3.99 MIL/uL (ref 3.87–5.11)
RDW: 13.5 % (ref 11.5–15.5)
WBC: 10.9 10*3/uL — ABNORMAL HIGH (ref 4.0–10.5)
nRBC: 0 % (ref 0.0–0.2)

## 2019-10-10 MED ORDER — PIPERACILLIN-TAZOBACTAM 3.375 G IVPB
3.3750 g | Freq: Three times a day (TID) | INTRAVENOUS | Status: DC
  Administered 2019-10-10 – 2019-10-11 (×4): 3.375 g via INTRAVENOUS
  Filled 2019-10-10 (×5): qty 50

## 2019-10-10 NOTE — Progress Notes (Signed)
Subjective: Patient reports improved pain/nausea, still some with movement, only needed tow dose of pain medicine overnight. Denies fever, chills this am   Objective: I have reviewed patient's vital signs, intake and output and medications.  General: alert and cooperative GI: soft, non-tender; bowel sounds normal; no masses,  no organomegaly Extremities: extremities normal, atraumatic, no cyanosis or edema   Assessment/Plan: 29yo G0 here with bilateral TOA on IV abx 1. TOA- continue IV abx zosyn/flagyl. Possible switch to PO tomorrow May discuss possible IR drain placement w/ radiology.   LOS: 1 day    Mackenzie Harris 10/10/2019, 8:06 AM

## 2019-10-10 NOTE — H&P (Addendum)
Mackenzie Harris is an 29 y.o. G0.  Who presented with abd pain.  Pain noted on Thursday, described as persistent cramping pain across her lower abdomen and and felt the need to have a bowel movement, however was not able to.  After a small bowel movement, pain still persisted.  Yesterday, pain was worse across her lower abdomen primarily on the right side.  She reports that she woke early this morning with 10/10 pain; she utilized a muscle relaxer and naproxen that she had at home with only mild improvement of her pain.  When it did not resolve, she decided it was time for her to come to the emergency department for evaluation.  The time of my exam her pain is rated a 7/10. She denies fevers or chills, nausea; vomiting.   Patient's last menstrual period was 09/16/2019.    Past Medical History:  Diagnosis Date  . Allergy   . Anxiety   . BV (bacterial vaginosis) 03/16/2015  . Chlamydia   . Contraceptive management 06/02/2013  . GERD (gastroesophageal reflux disease)   . History of abnormal cervical Pap smear 02/28/2015  . History of chlamydia 04/17/2015  . History of trichomoniasis 04/17/2015  . Ovarian cyst   . Trichimoniasis   . Vaginal discharge 06/09/2014  . Vaginal itching 06/09/2014  . Vaginal Pap smear, abnormal   . Yeast infection 06/09/2014    Past Surgical History:  Procedure Laterality Date  . FOOT SURGERY      Family History  Problem Relation Age of Onset  . Diabetes Mother        borderline  . Hypertension Father   . Diabetes Maternal Grandmother   . Hypertension Maternal Grandmother   . Diabetes Maternal Grandfather   . Hypertension Maternal Grandfather   . Alzheimer's disease Paternal Grandfather     Social History:  reports that she has never smoked. She has never used smokeless tobacco. She reports previous alcohol use. She reports that she does not use drugs.  Allergies: No Known Allergies  Medications Prior to Admission  Medication Sig Dispense Refill Last Dose  .  ibuprofen (ADVIL,MOTRIN) 600 MG tablet Take 1 tablet (600 mg total) by mouth 4 (four) times daily. 30 tablet 0 Past Month at Unknown time  . naproxen (NAPROSYN) 500 MG tablet Take 1 tablet (500 mg total) by mouth 2 (two) times daily. 10 tablet 0 10/09/2019 at Unknown time  . doxycycline (VIBRAMYCIN) 100 MG capsule Take 1 capsule (100 mg total) by mouth 2 (two) times daily. Take with food 14 capsule 0   . folic acid (FOLVITE) 1 MG tablet Take 1 tablet (1 mg total) by mouth daily. (Patient not taking: Reported on 04/09/2019)   Not Taking at Unknown time  . hydroxypropyl methylcellulose / hypromellose (ISOPTO TEARS / GONIOVISC) 2.5 % ophthalmic solution Place 1 drop into both eyes daily as needed for dry eyes.   Unknown at Unknown time  . methocarbamol (ROBAXIN) 500 MG tablet Take 1 tablet (500 mg total) by mouth every 8 (eight) hours as needed for muscle spasms. (Patient not taking: Reported on 04/09/2019) 15 tablet 0 Not Taking at Unknown time  . metroNIDAZOLE (FLAGYL) 500 MG tablet Take 1 tablet (500 mg total) by mouth 2 (two) times daily. (Patient not taking: Reported on 10/09/2019) 14 tablet 0 Not Taking at Unknown time  . oxyCODONE-acetaminophen (PERCOCET/ROXICET) 5-325 MG tablet Take 1 tablet by mouth every 6 (six) hours as needed. Take this medication with food (Patient not taking: Reported on 04/09/2019)  20 tablet 0 Not Taking at Unknown time  . traMADol (ULTRAM) 50 MG tablet Take 1 tablet (50 mg total) by mouth every 6 (six) hours as needed. (Patient not taking: Reported on 10/09/2019) 15 tablet 0 Not Taking at Unknown time    Review of Systems  Constitutional: Negative for activity change, appetite change, fatigue and unexpected weight change.  HENT: Negative.   Eyes: Negative.   Respiratory: Negative.  Negative for chest tightness, shortness of breath and wheezing.   Cardiovascular: Negative.  Negative for chest pain and leg swelling.  Gastrointestinal: Positive for abdominal pain. Negative for  abdominal distention, constipation, diarrhea, nausea and vomiting.  Endocrine: Negative.   Genitourinary: Positive for pelvic pain. Negative for dysuria and hematuria.  Neurological: Negative.  Negative for dizziness, weakness, light-headedness, numbness and headaches.  Hematological: Negative.   Psychiatric/Behavioral: Negative.  Negative for agitation, confusion and decreased concentration.    Blood pressure (!) 105/54, pulse 88, temperature 98.4 F (36.9 C), temperature source Oral, resp. rate 16, height 5\' 5"  (1.651 m), weight 108.9 kg, last menstrual period 09/16/2019, SpO2 98 %. Physical Exam Vitals reviewed.  Constitutional:      Appearance: She is well-developed.  HENT:     Head: Normocephalic and atraumatic.  Eyes:     Pupils: Pupils are equal, round, and reactive to light.  Cardiovascular:     Rate and Rhythm: Normal rate.  Pulmonary:     Effort: Pulmonary effort is normal.  Abdominal:     General: There is no distension.     Palpations: Abdomen is soft.     Tenderness: There is abdominal tenderness in the right lower quadrant and left lower quadrant. There is no guarding or rebound.  Musculoskeletal:     Cervical back: Normal range of motion.  Skin:    General: Skin is warm and dry.  Neurological:     Mental Status: She is alert and oriented to person, place, and time.  Psychiatric:        Behavior: Behavior normal.     Results for orders placed or performed during the hospital encounter of 10/09/19 (from the past 24 hour(s))  Urinalysis, Routine w reflex microscopic Urine, Clean Catch     Status: Abnormal   Collection Time: 10/09/19  9:17 AM  Result Value Ref Range   Color, Urine YELLOW YELLOW   APPearance HAZY (A) CLEAR   Specific Gravity, Urine 1.025 1.005 - 1.030   pH 6.0 5.0 - 8.0   Glucose, UA NEGATIVE NEGATIVE mg/dL   Hgb urine dipstick NEGATIVE NEGATIVE   Bilirubin Urine SMALL (A) NEGATIVE   Ketones, ur NEGATIVE NEGATIVE mg/dL   Protein, ur NEGATIVE  NEGATIVE mg/dL   Nitrite NEGATIVE NEGATIVE   Leukocytes,Ua SMALL (A) NEGATIVE   RBC / HPF 0-5 0 - 5 RBC/hpf   WBC, UA 6-10 0 - 5 WBC/hpf   Bacteria, UA RARE (A) NONE SEEN   Squamous Epithelial / LPF 11-20 0 - 5   Mucus PRESENT   Pregnancy, urine     Status: None   Collection Time: 10/09/19  9:17 AM  Result Value Ref Range   Preg Test, Ur NEGATIVE NEGATIVE  CBC with Differential     Status: Abnormal   Collection Time: 10/09/19  9:28 AM  Result Value Ref Range   WBC 14.0 (H) 4.0 - 10.5 K/uL   RBC 4.62 3.87 - 5.11 MIL/uL   Hemoglobin 13.8 12.0 - 15.0 g/dL   HCT 10/11/19 36 - 46 %   MCV  90.9 80.0 - 100.0 fL   MCH 29.9 26.0 - 34.0 pg   MCHC 32.9 30.0 - 36.0 g/dL   RDW 84.1 66.0 - 63.0 %   Platelets 273 150 - 400 K/uL   nRBC 0.0 0.0 - 0.2 %   Neutrophils Relative % 74 %   Neutro Abs 10.4 (H) 1.7 - 7.7 K/uL   Lymphocytes Relative 13 %   Lymphs Abs 1.8 0.7 - 4.0 K/uL   Monocytes Relative 12 %   Monocytes Absolute 1.6 (H) 0 - 1 K/uL   Eosinophils Relative 1 %   Eosinophils Absolute 0.1 0 - 0 K/uL   Basophils Relative 0 %   Basophils Absolute 0.1 0 - 0 K/uL   Immature Granulocytes 0 %   Abs Immature Granulocytes 0.04 0.00 - 0.07 K/uL  Comprehensive metabolic panel     Status: Abnormal   Collection Time: 10/09/19  9:28 AM  Result Value Ref Range   Sodium 134 (L) 135 - 145 mmol/L   Potassium 3.6 3.5 - 5.1 mmol/L   Chloride 100 98 - 111 mmol/L   CO2 23 22 - 32 mmol/L   Glucose, Bld 105 (H) 70 - 99 mg/dL   BUN 8 6 - 20 mg/dL   Creatinine, Ser 1.60 0.44 - 1.00 mg/dL   Calcium 9.1 8.9 - 10.9 mg/dL   Total Protein 7.4 6.5 - 8.1 g/dL   Albumin 3.7 3.5 - 5.0 g/dL   AST 13 (L) 15 - 41 U/L   ALT 14 0 - 44 U/L   Alkaline Phosphatase 58 38 - 126 U/L   Total Bilirubin 1.1 0.3 - 1.2 mg/dL   GFR calc non Af Amer >60 >60 mL/min   GFR calc Af Amer >60 >60 mL/min   Anion gap 11 5 - 15  I-Stat Beta hCG blood, ED ([facility], [facility], [facility] only)     Status: None   Collection Time: 10/09/19 10:12 AM  Result  Value Ref Range   I-stat hCG, quantitative <5.0 <5 mIU/mL   Comment 3          Wet prep, genital     Status: Abnormal   Collection Time: 10/09/19 12:36 PM   Specimen: PATH Cytology Cervicovaginal Ancillary Only  Result Value Ref Range   Yeast Wet Prep HPF POC NONE SEEN NONE SEEN   Trich, Wet Prep NONE SEEN NONE SEEN   Clue Cells Wet Prep HPF POC NONE SEEN NONE SEEN   WBC, Wet Prep HPF POC MANY (A) NONE SEEN   Sperm NONE SEEN   Respiratory Panel by RT PCR (Flu A&B, Covid) - Nasopharyngeal Swab     Status: None   Collection Time: 10/09/19  4:01 PM   Specimen: Nasopharyngeal Swab  Result Value Ref Range   SARS Coronavirus 2 by RT PCR NEGATIVE NEGATIVE   Influenza A by PCR NEGATIVE NEGATIVE   Influenza B by PCR NEGATIVE NEGATIVE    CT Abdomen Pelvis W Contrast  Result Date: 10/09/2019 CLINICAL DATA:  29 year old female with acute RIGHT abdominal and pelvic pain. EXAM: CT ABDOMEN AND PELVIS WITH CONTRAST TECHNIQUE: Multidetector CT imaging of the abdomen and pelvis was performed using the standard protocol following bolus administration of intravenous contrast. CONTRAST:  OMNIPAQUE IOHEXOL 300 MG/ML  SOLN COMPARISON:  None. FINDINGS: Lower chest: No acute abnormality. A calcified granuloma in the LEFT LOWER lobe is present. Hepatobiliary: The liver and gallbladder are unremarkable. No biliary dilatation. Pancreas: Unremarkable Spleen: Unremarkable Adrenals/Urinary Tract: The kidneys, adrenal glands and  bladder are unremarkable. Stomach/Bowel: Stomach is within normal limits. Appendix appears normal. No evidence of bowel wall thickening, distention, or inflammatory changes. Vascular/Lymphatic: No significant vascular findings are present. No enlarged abdominal or pelvic lymph nodes. Reproductive: A 5.5 cm thick-walled cystic structure in the LEFT adnexal region is noted. A tubular structure in the RIGHT adnexal region is noted. Stranding/inflammation within the pelvis is present. The uterus is  grossly unremarkable. Other: A trace amount of free fluid in the pelvis is noted. No evidence of pneumoperitoneum. Musculoskeletal: No acute or suspicious bony abnormalities noted. IMPRESSION: 1. 5.5 cm thick-walled cystic structure in the LEFT adnexal region and tubular structure within the RIGHT adnexal region with adjacent stranding/inflammation within the pelvis. The LEFT adnexal abnormality is nonspecific and may represent ovarian torsion, complex or complicated cyst or abscess. The RIGHT adnexal abnormality probably represents a dilated fallopian tube with possible infection/abscess. Further evaluation with pelvic ultrasound is recommended. 2. No other significant abnormalities. Electronically Signed   By: Harmon PierJeffrey  Hu M.D.   On: 10/09/2019 11:55   US PELVIC COMPLETE W TRANSVAGINAL AND TORSION R/O  Result Date: 10/09/2019 CLINICAL DATA:  Evaluate for possible ovarian torsion. EXAM: TRANSABDOMINAL AND TRANSVAGINAL ULTRASOUND OF PELVIS DOPPLER ULTRASOUND OF OVARIES TECHNIQUE: Both transabdominal and transvaginal ultrasound examinations of the pelvis were performed. Transabdominal technique was performed for global imaging of the pelvis including uterus, ovaries, adnexal regions, and pelvic cul-de-sac. It was necessary to proceed with endovaginal exam following the transabdominal exam to visualize the adnexal structures. Color and duplex Doppler ultrasound was utilized to evaluate blood flow to the ovaries. COMPARISON:  CT abdomen pelvis October 09, 2019 FINDINGS: Uterus Measurements: 6.0 x 3.3 x 3.8 cm = volume: 39 mL. No fibroids or other mass visualized. Endometrium Thickness: 10 mm.  No focal abnormality visualized. Right ovary Measurements: 4.6 x 2.9 x 3.3 cm = volume: 23 mL. Within the right adnexa adjacent to the right ovary there is a complex tubular structure most compatible with flow can 2 and complex fluid. Left ovary Measurements: 6.0 x 4.0 x 5.3 cm = volume: 65 mL. Heterogeneous in echogenicity  with multiple internal cystic structures. Small amount of surrounding fluid. Pulsed Doppler evaluation of the right ovary demonstrates normal low-resistance arterial and venous waveforms. Only peripheral flow is demonstrated about the left ovary. Other findings Small amount of free fluid in the pelvis. IMPRESSION: 1. The left ovary is enlarged and heterogeneous in echogenicity without internal color flow. Given the overall findings within the pelvis and clinical presentation, this is favored to represent left adnexal tubo-ovarian abscess. Additional considerations for the appearance of the left ovary would be torsion of the left ovary or potentially left ovarian hemorrhagic cyst. 2. Findings most compatible with right adnexal pyosalpinx. 3. These results were called by telephone at the time of interpretation on 10/09/2019 at 3:21 pm to provider Rubin PayorPickering, who verbally acknowledged these results. Electronically Signed   By: Annia Beltrew  Davis M.D.   On: 10/09/2019 15:24    Assessment/Plan: Bilateral TOA: wet prep neg, GC/Chlamydia pending. IV abx zosyn/flagyl until pain/clinical improvement. Then transition to PO abx doxy/flagyl.  PO analgesia prn mod/severe pain.  Will contact IR in AM for possible U/S guided drain placement.   Malachy ChamberLandon T Adyn Hoes 10/09/19, 2028

## 2019-10-11 DIAGNOSIS — Z20822 Contact with and (suspected) exposure to covid-19: Secondary | ICD-10-CM | POA: Diagnosis present

## 2019-10-11 DIAGNOSIS — N7093 Salpingitis and oophoritis, unspecified: Secondary | ICD-10-CM | POA: Diagnosis present

## 2019-10-11 DIAGNOSIS — N83512 Torsion of left ovary and ovarian pedicle: Secondary | ICD-10-CM | POA: Diagnosis present

## 2019-10-11 DIAGNOSIS — R109 Unspecified abdominal pain: Secondary | ICD-10-CM | POA: Diagnosis present

## 2019-10-11 LAB — GC/CHLAMYDIA PROBE AMP (~~LOC~~) NOT AT ARMC
Chlamydia: NEGATIVE
Comment: NEGATIVE
Comment: NORMAL
Neisseria Gonorrhea: NEGATIVE

## 2019-10-11 MED ORDER — METRONIDAZOLE 500 MG PO TABS
500.0000 mg | ORAL_TABLET | Freq: Two times a day (BID) | ORAL | Status: DC
  Administered 2019-10-11 – 2019-10-12 (×2): 500 mg via ORAL
  Filled 2019-10-11 (×2): qty 1

## 2019-10-11 MED ORDER — SODIUM CHLORIDE 0.9% FLUSH: 3.0000 mL | INTRAVENOUS | Status: DC | PRN

## 2019-10-11 MED ORDER — ACETAMINOPHEN 325 MG PO TABS: 650.0000 mg | ORAL_TABLET | ORAL | Status: DC | PRN

## 2019-10-11 MED ORDER — SODIUM CHLORIDE 0.9 % IV SOLN: 250.0000 mL | INTRAVENOUS | Status: DC | PRN

## 2019-10-11 MED ORDER — SODIUM CHLORIDE 0.9% FLUSH
3.0000 mL | Freq: Two times a day (BID) | INTRAVENOUS | Status: DC
  Administered 2019-10-11 – 2019-10-12 (×3): 3 mL via INTRAVENOUS

## 2019-10-11 MED ORDER — DOXYCYCLINE HYCLATE 100 MG PO TABS
100.0000 mg | ORAL_TABLET | Freq: Two times a day (BID) | ORAL | Status: DC
  Administered 2019-10-11 – 2019-10-12 (×3): 100 mg via ORAL
  Filled 2019-10-11 (×3): qty 1

## 2019-10-11 MED ORDER — IBUPROFEN 600 MG PO TABS: 600.0000 mg | ORAL_TABLET | Freq: Four times a day (QID) | ORAL | Status: DC | PRN

## 2019-10-11 NOTE — Progress Notes (Addendum)
Daily Gynecology Note  Admission Date: 10/09/2019 Current Date: 10/11/2019 9:47 AM  Mackenzie Harris is a 29 y.o. G0 HD#3, admitted for b/l TOAs.  Pregnancy complicated by: Patient Active Problem List   Diagnosis Date Noted  . TOA (tubo-ovarian abscess) 10/09/2019  . History of chlamydia 04/17/2015  . History of trichomoniasis 04/17/2015  . BV (bacterial vaginosis) 03/16/2015  . History of abnormal cervical Pap smear 02/28/2015  . Vaginal itching 06/09/2014  . Vaginal discharge 06/09/2014  . Yeast infection 06/09/2014  . Contraceptive management 06/02/2013    Overnight/24hr events:  none  Subjective:  Upset belly but pain gone  Objective:     Current Vital Signs 24h Vital Sign Ranges  T 97.9 F (36.6 C) Temp  Avg: 98.2 F (36.8 C)  Min: 97.9 F (36.6 C)  Max: 98.5 F (36.9 C)  BP (!) 116/53 BP  Min: 98/51  Max: 116/53  HR 64 Pulse  Avg: 65.3  Min: 63  Max: 68  RR 17 Resp  Avg: 17.2  Min: 16  Max: 18  SaO2 100 %  (Room Air) SpO2  Avg: 97 %  Min: 88 %  Max: 100 %       24 Hour I/O Current Shift I/O  Time Ins Outs 09/26 0701 - 09/27 0700 In: 1755 [P.O.:480; I.V.:625] Out: 700 [Urine:700] No intake/output data recorded.   Patient Vitals for the past 24 hrs:  BP Temp Temp src Pulse Resp SpO2  10/11/19 0809 (!) 116/53 97.9 F (36.6 C) Oral 64 17 100 %  10/11/19 0431 (!) 108/53 98.4 F (36.9 C) Oral 63 16 (!) 88 %  10/11/19 0006 (!) 102/47 98.2 F (36.8 C) Oral 68 16 98 %  10/10/19 2024 102/63 98.5 F (36.9 C) Oral 64 18 98 %  10/10/19 1522 (!) 98/51 98.2 F (36.8 C) Oral 67 18 99 %  10/10/19 1117 103/63 98.2 F (36.8 C) Oral 66 18 99 %    Physical exam: General: Well nourished, well developed female in no acute distress. Abdomen: obese, nttp nd, +BS Cardiovascular: S1, S2 normal, no murmur, rub or gallop, regular rate and rhythm Respiratory: CTAB Extremities: no clubbing, cyanosis or edema Skin: Warm and dry.   Medications: Current Facility-Administered  Medications  Medication Dose Route Frequency Provider Last Rate Last Admin  . 0.9 %  sodium chloride infusion   Intravenous Continuous Malachy Chamber, MD 125 mL/hr at 10/11/19 0624 New Bag at 10/11/19 5809  . acetaminophen (TYLENOL) tablet 650 mg  650 mg Oral Q4H PRN Raynelle Dick T, MD      . doxycycline (VIBRA-TABS) tablet 100 mg  100 mg Oral Q12H Irrigon Bing, MD      . HYDROcodone-acetaminophen (NORCO/VICODIN) 5-325 MG per tablet 1-2 tablet  1-2 tablet Oral Q4H PRN Malachy Chamber, MD   1 tablet at 10/10/19 0523  . ketorolac (TORADOL) 30 MG/ML injection 15 mg  15 mg Intravenous Q6H Raynelle Dick T, MD   15 mg at 10/11/19 9833  . metroNIDAZOLE (FLAGYL) IVPB 500 mg  500 mg Intravenous Q8H Raynelle Dick T, MD 100 mL/hr at 10/11/19 0822 500 mg at 10/11/19 8250  . piperacillin-tazobactam (ZOSYN) IVPB 3.375 g  3.375 g Intravenous Q8H Raynelle Dick T, MD 12.5 mL/hr at 10/11/19 0427 3.375 g at 10/11/19 0427  . zolpidem (AMBIEN) tablet 5 mg  5 mg Oral QHS PRN Malachy Chamber, MD        Labs:  Recent Labs  Lab 10/09/19 [phone] 10/10/19 [phone]  WBC 14.0* 10.9*  HGB 13.8 11.6*  HCT 42.0 36.0  PLT 273 238    Recent Labs  Lab 10/09/19 0928 10/10/19 0733  NA 134* 137  K 3.6 4.2  CL 100 105  CO2 23 25  BUN 8 8  CREATININE 0.63 0.82  CALCIUM 9.1 8.4*  PROT 7.4 5.9*  BILITOT 1.1 0.8  ALKPHOS 58 49  ALT 14 10  AST 13* 11*  GLUCOSE 105* 94   Pending: GC/CT Wet prep negative  Radiology:  No new imaging  Assessment & Plan:  Pt doing well *GYN: transition to PO abx of doxy and flagyl, pt previously on zosyn and flagyl since admission and got a dose of rocephin in the ED. Recommend f/u later this week/early this week and rpt imaging in 8wks mid November. Periods usually first week of month. Pt not currently on any BC *PPx: SCDs, OOB ad lib *FEN/GI: will SLIV, regular diet *Dispo: likey tomorrow  Cornelia Copa MD Attending Center for [facility]  (Faculty Practice) GYN Consult Phone: [phone] (M-F, [phone]) & [phone] (Off hours, weekends, holidays)

## 2019-10-12 MED ORDER — DOXYCYCLINE HYCLATE 100 MG PO CAPS: 100.0000 mg | ORAL_CAPSULE | Freq: Two times a day (BID) | ORAL | 0 refills | Status: AC

## 2019-10-12 MED ORDER — IBUPROFEN 600 MG PO TABS: 600.0000 mg | ORAL_TABLET | Freq: Four times a day (QID) | ORAL | 0 refills | Status: DC | PRN

## 2019-10-12 MED ORDER — DOXYCYCLINE HYCLATE 100 MG PO CAPS: 100.0000 mg | ORAL_CAPSULE | Freq: Two times a day (BID) | ORAL | 0 refills | Status: DC

## 2019-10-12 MED ORDER — METRONIDAZOLE 500 MG PO TABS: 500.0000 mg | ORAL_TABLET | Freq: Two times a day (BID) | ORAL | 0 refills | Status: AC

## 2019-10-12 MED ORDER — HYDROCODONE-ACETAMINOPHEN 5-325 MG PO TABS: 1.0000 | ORAL_TABLET | ORAL | 0 refills | Status: DC | PRN

## 2019-10-12 NOTE — Plan of Care (Signed)
  Problem: Activity: Goal: Risk for activity intolerance will decrease Outcome: Completed/Met   Problem: Nutrition: Goal: Adequate nutrition will be maintained Outcome: Completed/Met   Problem: Coping: Goal: Level of anxiety will decrease Outcome: Completed/Met   Problem: Pain Managment: Goal: General experience of comfort will improve Outcome: Completed/Met   

## 2019-10-12 NOTE — Discharge Instructions (Addendum)
Take a quarter cup of Kefir every night for 2-3 weeks  Pelvic Inflammatory Disease  Pelvic inflammatory disease (PID) is caused by an infection in some or all of the female reproductive organs. The infection can be in the uterus, ovaries, fallopian tubes, or the surrounding tissues in the pelvis. PID can cause abdominal or pelvic pain that comes on suddenly (acute pelvic pain). PID is a serious infection because it can lead to lasting (chronic) pelvic pain or the inability to have children (infertility). What are the causes? This condition is most often caused by bacteria that is spread during sexual contact. It can also be caused by a bacterial infection of the vagina (bacterial vaginosis) that is not spread by sexual contact. This condition occurs when the infection is not treated and the bacteria travel upward from the vagina or cervix into the reproductive organs. Bacteria may also be introduced into the reproductive organs following:  The birth of a baby.  A miscarriage.  An abortion.  Major pelvic surgery.  The insertion of an intrauterine device (IUD).  A sexual assault. What increases the risk? You are more likely to develop this condition if you:  Are younger than 30 years of age.  Are sexually active at a young age.  Have a history of STI (sexually transmitted infection) or PID.  Do not regularly use barrier contraception methods, such as condoms.  Have multiple sexual partners.  Have sex with someone who has symptoms of an STI.  Use a vaginal douche.  Have recently had an IUD inserted. What are the signs or symptoms? Symptoms of this condition include:  Abdominal or pelvic pain.  Fever.  Chills.  Abnormal vaginal discharge.  Abnormal uterine bleeding.  Unusual pain shortly after the end of a menstrual period.  Painful urination.  Pain with sex.  Nausea and vomiting. How is this diagnosed? This condition is diagnosed based on a pelvic exam and  medical history. A pelvic exam can reveal signs of infection, inflammation, and discharge in the vagina and the surrounding tissues. It can also help to identify painful areas. You may also have tests, such as:  Lab tests, including a pregnancy test, blood tests, and a urine test.  Culture tests of the vagina and cervix to check for an STI.  Ultrasound.  A laparoscopic procedure to look inside the pelvis.  Examination of vaginal discharge under a microscope. How is this treated? This condition may be treated with:  Antibiotic medicines taken by mouth (orally). For more severe cases, antibiotics may be given through an IV at the hospital.  Surgery. This is rare. Surgery may be needed if other treatments do not help.  Efforts to stop the spread of the infection. Sexual partners may need to be treated if the infection is caused by an STI. It may take weeks until you are completely well. If you are diagnosed with PID, you should also be checked for HIV (human immunodeficiency virus). Your health care provider may test you for infection again 3 months after treatment. You should not have unprotected sex. Follow these instructions at home:  Take over-the-counter and prescription medicines only as told by your health care provider.  If you were prescribed an antibiotic medicine, take it as told by your health care provider. Do not stop using the antibiotic even if you start to feel better.  Do not have sex until treatment is completed or as told by your health care provider. If PID is confirmed, your recent sexual partners  will need treatment, especially if you had unprotected sex.  Keep all follow-up visits as told by your health care provider. This is important. Contact a health care provider if:  You have increased or abnormal vaginal discharge.  Your pain does not improve.  You vomit.  You have a fever.  You cannot tolerate your medicines.  Your partner has an STI.  You have  pain when you urinate. Get help right away if:  You have increased abdominal or pelvic pain.  You have chills.  Your symptoms are not better in 72 hours with treatment. Summary  Pelvic inflammatory disease (PID) is caused by an infection in some or all of the female reproductive organs.  PID is a serious infection because it can lead to lasting (chronic) pelvic pain or the inability to have children (infertility).  This infection is usually treated with antibiotic medicines.  Do not have sex until treatment is completed or as told by your health care provider. This information is not intended to replace advice given to you by your health care provider. Make sure you discuss any questions you have with your health care provider. Document Revised: 09/18/2017 Document Reviewed: 09/23/2017 Elsevier Patient Education  2020 [employer].

## 2019-10-12 NOTE — Discharge Summary (Signed)
Physician Discharge Summary  Patient ID: Mackenzie Harris MRN: [id number] DOB/AGE: [birth year] 29 y.o.  Admit date: 10/09/2019 Discharge date: 10/12/2019  Admission Diagnoses: Abdominal pain. Bilateral TOAs. History of STD  Discharge Diagnoses: Abdominal pain resolved  Discharged Condition: good  Hospital Course: patient admitted and given one dose of rocephin in the ED and then started on zosyn and flagyl. She was transitioned to doxy and flagyl and did well on this.   Consults: None  Significant Diagnostic Studies: negative wet prep, GC/CT Narrative & Impression  CLINICAL DATA:  Evaluate for possible ovarian torsion.  EXAM: TRANSABDOMINAL AND TRANSVAGINAL ULTRASOUND OF PELVIS  DOPPLER ULTRASOUND OF OVARIES  TECHNIQUE: Both transabdominal and transvaginal ultrasound examinations of the pelvis were performed. Transabdominal technique was performed for global imaging of the pelvis including uterus, ovaries, adnexal regions, and pelvic cul-de-sac.  It was necessary to proceed with endovaginal exam following the transabdominal exam to visualize the adnexal structures. Color and duplex Doppler ultrasound was utilized to evaluate blood flow to the ovaries.  COMPARISON:  CT abdomen pelvis October 09, 2019  FINDINGS: Uterus  Measurements: 6.0 x 3.3 x 3.8 cm = volume: 39 mL. No fibroids or other mass visualized.  Endometrium  Thickness: 10 mm.  No focal abnormality visualized.  Right ovary  Measurements: 4.6 x 2.9 x 3.3 cm = volume: 23 mL. Within the right adnexa adjacent to the right ovary there is a complex tubular structure most compatible with flow can 2 and complex fluid.  Left ovary  Measurements: 6.0 x 4.0 x 5.3 cm = volume: 65 mL. Heterogeneous in echogenicity with multiple internal cystic structures. Small amount of surrounding fluid.  Pulsed Doppler evaluation of the right ovary demonstrates normal low-resistance arterial and venous waveforms.  Only peripheral flow is demonstrated about the left ovary.  Other findings  Small amount of free fluid in the pelvis.  IMPRESSION: 1. The left ovary is enlarged and heterogeneous in echogenicity without internal color flow. Given the overall findings within the pelvis and clinical presentation, this is favored to represent left adnexal tubo-ovarian abscess. Additional considerations for the appearance of the left ovary would be torsion of the left ovary or potentially left ovarian hemorrhagic cyst. 2. Findings most compatible with right adnexal pyosalpinx. 3. These results were called by telephone at the time of interpretation on 10/09/2019 at 3:21 pm to provider Rubin Payor, who verbally acknowledged these results.   Electronically Signed   By: Annia Belt M.D.   On: 10/09/2019 15:24    Treatments: IV abx  Discharge Exam: Blood pressure (!) 134/94, pulse 62, temperature 98.4 F (36.9 C), temperature source Oral, resp. rate 18, height 5\' 5"  (1.651 m), weight 108.9 kg, last menstrual period 09/16/2019, SpO2 100 %. General appearance: alert Resp: clear to auscultation bilaterally Cardio: regular rate and rhythm, S1, S2 normal, no murmur, click, rub or gallop GI: soft, non-tender; bowel sounds normal; no masses,  no organomegaly Skin: Skin color, texture, turgor normal. No rashes or lesions Neurologic: Grossly normal  Disposition: Discharge disposition: 01-Home or Self Care       Discharge Instructions    Discharge patient   Complete by: As directed    Discharge disposition: 01-Home or Self Care   Discharge patient date: 10/12/2019     Allergies as of 10/12/2019   No Known Allergies     Medication List    STOP taking these medications   naproxen 500 MG tablet Commonly known as: NAPROSYN   oxyCODONE-acetaminophen 5-325 MG tablet Commonly known as: PERCOCET/ROXICET  traMADol 50 MG tablet Commonly known as: ULTRAM     TAKE these medications    doxycycline 100 MG capsule Commonly known as: VIBRAMYCIN Take 1 capsule (100 mg total) by mouth 2 (two) times daily for 14 days. Take with food   folic acid 1 MG tablet Commonly known as: FOLVITE Take 1 tablet (1 mg total) by mouth daily.   HYDROcodone-acetaminophen 5-325 MG tablet Commonly known as: NORCO/VICODIN Take 1-2 tablets by mouth every 4 (four) hours as needed for moderate pain.   hydroxypropyl methylcellulose / hypromellose 2.5 % ophthalmic solution Commonly known as: ISOPTO TEARS / GONIOVISC Place 1 drop into both eyes daily as needed for dry eyes.   ibuprofen 600 MG tablet Commonly known as: ADVIL Take 1 tablet (600 mg total) by mouth every 6 (six) hours as needed for mild pain or cramping. What changed:   when to take this  reasons to take this   methocarbamol 500 MG tablet Commonly known as: ROBAXIN Take 1 tablet (500 mg total) by mouth every 8 (eight) hours as needed for muscle spasms.   metroNIDAZOLE 500 MG tablet Commonly known as: FLAGYL Take 1 tablet (500 mg total) by mouth 2 (two) times daily for 14 days.       Follow-up Information    Center for [facility]. Go in 1 week(s).   Specialty: Obstetrics and Gynecology Contact information: [address] [place] [address] [phone]              Signed: [place]field Bing 10/12/2019, 10:00 AM

## 2019-10-13 ENCOUNTER — Telehealth: Payer: Self-pay | Admitting: Obstetrics and Gynecology

## 2019-10-13 NOTE — Telephone Encounter (Signed)
Patient called, said she was seen in the Hospital and was told to follow up here, I offered patient an appointment with Dr.Pickens on Monday 10-18-2019, patient state she work Monday through Friday 8-5 and she cant take any time off. Patient said she will call the office back when she can schedule

## 2019-10-18 ENCOUNTER — Other Ambulatory Visit: Payer: Self-pay

## 2019-10-18 ENCOUNTER — Ambulatory Visit (INDEPENDENT_AMBULATORY_CARE_PROVIDER_SITE_OTHER): Payer: 59 | Admitting: Obstetrics and Gynecology

## 2019-10-18 ENCOUNTER — Encounter: Payer: Self-pay | Admitting: Obstetrics and Gynecology

## 2019-10-18 VITALS — BP 129/85 | HR 66 | Wt 244.9 lb

## 2019-10-18 DIAGNOSIS — N7093 Salpingitis and oophoritis, unspecified: Secondary | ICD-10-CM | POA: Diagnosis not present

## 2019-10-18 DIAGNOSIS — B373 Candidiasis of vulva and vagina: Secondary | ICD-10-CM | POA: Diagnosis not present

## 2019-10-18 DIAGNOSIS — B3731 Acute candidiasis of vulva and vagina: Secondary | ICD-10-CM

## 2019-10-18 MED ORDER — FLUCONAZOLE 150 MG PO TABS: ORAL_TABLET | ORAL | 0 refills | Status: DC

## 2019-10-18 NOTE — Progress Notes (Signed)
Thinks she has a yeast infection ans states its making her anus itch also. Having minimum cramping but states her menstrual cycle has came on.

## 2019-10-18 NOTE — Progress Notes (Signed)
Obstetrics and Gynecology Visit Return Patient Evaluation  Appointment Date: 10/18/2019  Primary Care Provider: Patient, No Pcp Per  OBGYN Clinic: [facility] for [facility]  Chief Complaint: f/u hospitalization for TOA  History of Present Illness:  Lynann Demetrius is a 29 y.o. P0 who was inpatient from 9/25 to 9/28 with pain and recurrent b/l TOAs. PMHx significant for BMI 40s, h/o STD in the remote past.   Interval History: She was discharged to home on doxy and flagyl and she continues to take this. Pain minimal to none and no systemic s/s. Period just started. She has some GU itching.   Review of Systems:  as noted in the History of Present Illness.  Patient Active Problem List   Diagnosis Date Noted  . TOA (tubo-ovarian abscess) 10/09/2019  . History of chlamydia 04/17/2015  . History of trichomoniasis 04/17/2015  . BV (bacterial vaginosis) 03/16/2015  . History of abnormal cervical Pap smear 02/28/2015  . Vaginal itching 06/09/2014  . Vaginal discharge 06/09/2014  . Yeast infection 06/09/2014  . Contraceptive management 06/02/2013   Medications:  Aliea Bosak had no medications administered during this visit. Current Outpatient Medications  Medication Sig Dispense Refill  . doxycycline (VIBRAMYCIN) 100 MG capsule Take 1 capsule (100 mg total) by mouth 2 (two) times daily for 14 days. Take with food 28 capsule 0  . hydroxypropyl methylcellulose / hypromellose (ISOPTO TEARS / GONIOVISC) 2.5 % ophthalmic solution Place 1 drop into both eyes daily as needed for dry eyes.    . metroNIDAZOLE (FLAGYL) 500 MG tablet Take 1 tablet (500 mg total) by mouth 2 (two) times daily for 14 days. 28 tablet 0  . folic acid (FOLVITE) 1 MG tablet Take 1 tablet (1 mg total) by mouth daily. (Patient not taking: Reported on 04/09/2019)    . HYDROcodone-acetaminophen (NORCO/VICODIN) 5-325 MG tablet Take 1-2 tablets by mouth every 4 (four) hours as needed for moderate pain.  (Patient not taking: Reported on 10/18/2019) 20 tablet 0  . ibuprofen (ADVIL) 600 MG tablet Take 1 tablet (600 mg total) by mouth every 6 (six) hours as needed for mild pain or cramping. 30 tablet 0  . methocarbamol (ROBAXIN) 500 MG tablet Take 1 tablet (500 mg total) by mouth every 8 (eight) hours as needed for muscle spasms. (Patient not taking: Reported on 04/09/2019) 15 tablet 0   No current facility-administered medications for this visit.    Allergies: has No Known Allergies.  Physical Exam:  BP 129/85   Pulse 66   Wt 244 lb 14.4 oz (111.1 kg)   LMP 10/17/2019   BMI 40.75 kg/m  Body mass index is 40.75 kg/m. General appearance: Well nourished, well developed female in no acute distress.  Abdomen: diffusely non tender to palpation, non distended, and no masses, hernias Neuro/Psych:  Normal mood and affect.     Assessment: pt doing well  Plan:  1. TOA (tubo-ovarian abscess) I told her to finish out the abx and recommend rpt u/s in about a month to see if improvement in imaging. I also d/w her again re: fertility obstacles. I told her that it would be difficult for her to get pregnant in the future and if she did she has a high chance for an ectopic pregnancy. This is her 2nd hospitalization for this and I d/w her re: potential surgical removal as she is at high risk for another episode sometime in the future. I told her that if she did this she could only get  pregnant via IVF. She states she is fine with this and knows the risks of tubes being removed. I told her we'll talk more after her u/s but that if she wants to proceed with surgery, I'd recommend waiting at least 3-6 months from her hospitalization to let the inflammation go down to decrease risks with surgery. - US PELVIC COMPLETE WITH TRANSVAGINAL; Future  2. Vulvovaginal candidiasis Diflucan x 2 sent in   RTC: follow up after u/s  Cornelia Copa MD Attending Center for [facility])

## 2019-10-27 ENCOUNTER — Ambulatory Visit: Payer: 59

## 2019-11-30 ENCOUNTER — Other Ambulatory Visit: Payer: Self-pay

## 2019-11-30 ENCOUNTER — Ambulatory Visit
Admission: RE | Admit: 2019-11-30 | Discharge: 2019-11-30 | Disposition: A | Discharge status: Home / Self Care | Payer: 59 | Source: Ambulatory Visit | Attending: Obstetrics and Gynecology | Admitting: Obstetrics and Gynecology

## 2019-11-30 DIAGNOSIS — N7093 Salpingitis and oophoritis, unspecified: Secondary | ICD-10-CM | POA: Diagnosis not present

## 2019-12-22 ENCOUNTER — Telehealth: Payer: Self-pay | Admitting: Obstetrics and Gynecology

## 2019-12-22 NOTE — Telephone Encounter (Signed)
Pt states she needs to speak with Dr Vergie Living concerning Ultrasound results. Dr Vergie Living called pt yesterday but phone not working, pt states phone lines might have been down in the area. Request to please call her back. thanks

## 2019-12-23 ENCOUNTER — Telehealth: Payer: Self-pay | Admitting: Obstetrics and Gynecology

## 2019-12-23 DIAGNOSIS — N7093 Salpingitis and oophoritis, unspecified: Secondary | ICD-10-CM

## 2019-12-23 MED ORDER — DOXYCYCLINE HYCLATE 100 MG PO CAPS: 100.0000 mg | ORAL_CAPSULE | Freq: Two times a day (BID) | ORAL | 0 refills | Status: AC

## 2019-12-23 MED ORDER — METRONIDAZOLE 500 MG PO TABS: 500.0000 mg | ORAL_TABLET | Freq: Two times a day (BID) | ORAL | 0 refills | Status: AC

## 2019-12-23 NOTE — Telephone Encounter (Signed)
GYN Note U/s shows smaller but persistent TOA. D/w her options would be CT and talk to IR or a full month of abx; pt has done two weeks already. I told her I recommend the latter, which she is amenable to. If still persistent, then can to IR.  I told her I recommend kefir one cup at night or daily sauerkrat as a probiotic  Request sent for rpt early cycle imaging in one month; her cycles are in the beginning of the month.   Cornelia Copa MD Attending Center for [employer] (Faculty Practice) 12/23/2019 Time: [phone]

## 2020-01-10 ENCOUNTER — Other Ambulatory Visit: Payer: Self-pay | Admitting: *Deleted

## 2020-01-10 MED ORDER — FLUCONAZOLE 150 MG PO TABS: 150.0000 mg | ORAL_TABLET | Freq: Once | ORAL | 0 refills | Status: AC

## 2020-01-31 ENCOUNTER — Ambulatory Visit: Payer: 59

## 2020-02-08 ENCOUNTER — Encounter (HOSPITAL_COMMUNITY): Payer: Self-pay | Admitting: Emergency Medicine

## 2020-02-08 ENCOUNTER — Other Ambulatory Visit: Payer: Self-pay

## 2020-02-08 DIAGNOSIS — M25512 Pain in left shoulder: Secondary | ICD-10-CM | POA: Diagnosis not present

## 2020-02-08 DIAGNOSIS — R110 Nausea: Secondary | ICD-10-CM | POA: Diagnosis not present

## 2020-02-08 DIAGNOSIS — R059 Cough, unspecified: Secondary | ICD-10-CM | POA: Insufficient documentation

## 2020-02-08 DIAGNOSIS — R0789 Other chest pain: Secondary | ICD-10-CM | POA: Diagnosis not present

## 2020-02-08 DIAGNOSIS — M25511 Pain in right shoulder: Secondary | ICD-10-CM | POA: Insufficient documentation

## 2020-02-08 DIAGNOSIS — Z20822 Contact with and (suspected) exposure to covid-19: Secondary | ICD-10-CM | POA: Insufficient documentation

## 2020-02-08 DIAGNOSIS — M545 Low back pain, unspecified: Secondary | ICD-10-CM | POA: Diagnosis not present

## 2020-02-08 DIAGNOSIS — R079 Chest pain, unspecified: Secondary | ICD-10-CM | POA: Diagnosis present

## 2020-02-08 NOTE — ED Triage Notes (Signed)
Pt c/o chest pain underneath her left breast and a deadweight feeling in her left arm from her shoulder to elbow.

## 2020-02-09 ENCOUNTER — Emergency Department (HOSPITAL_COMMUNITY)
Admission: EM | Admit: 2020-02-09 | Discharge: 2020-02-09 | Disposition: A | Discharge status: Home / Self Care | Payer: 59 | Attending: Emergency Medicine | Admitting: Emergency Medicine

## 2020-02-09 DIAGNOSIS — R0789 Other chest pain: Secondary | ICD-10-CM

## 2020-02-09 LAB — CBC
HCT: 43.3 % (ref 36.0–46.0)
Hemoglobin: 13.9 g/dL (ref 12.0–15.0)
MCH: 29.4 pg (ref 26.0–34.0)
MCHC: 32.1 g/dL (ref 30.0–36.0)
MCV: 91.7 fL (ref 80.0–100.0)
Platelets: 286 10*3/uL (ref 150–400)
RBC: 4.72 MIL/uL (ref 3.87–5.11)
RDW: 13.6 % (ref 11.5–15.5)
WBC: 9.3 10*3/uL (ref 4.0–10.5)
nRBC: 0 % (ref 0.0–0.2)

## 2020-02-09 LAB — BASIC METABOLIC PANEL
Anion gap: 9 (ref 5–15)
BUN: 10 mg/dL (ref 6–20)
CO2: 24 mmol/L (ref 22–32)
Calcium: 9.2 mg/dL (ref 8.9–10.3)
Chloride: 104 mmol/L (ref 98–111)
Creatinine, Ser: 0.78 mg/dL (ref 0.44–1.00)
GFR, Estimated: >60 mL/min (ref >60)
Glucose, Bld: 93 mg/dL (ref 70–99)
Potassium: 3.7 mmol/L (ref 3.5–5.1)
Sodium: 137 mmol/L (ref 135–145)

## 2020-02-09 LAB — TROPONIN I (HIGH SENSITIVITY)
Troponin I (High Sensitivity): <2 ng/L (ref <18)
Troponin I (High Sensitivity): 2 ng/L (ref <18)

## 2020-02-09 LAB — POC URINE PREG, ED: Preg Test, Ur: NEGATIVE

## 2020-02-09 LAB — D-DIMER, QUANTITATIVE: D-Dimer, Quant: <0.27 ug/mL-FEU (ref 0.00–0.50)

## 2020-02-09 MED ORDER — KETOROLAC TROMETHAMINE 30 MG/ML IJ SOLN
30.0000 mg | Freq: Once | INTRAMUSCULAR | Status: AC
  Administered 2020-02-09: 30 mg via INTRAMUSCULAR
  Filled 2020-02-09: qty 1

## 2020-02-09 MED ORDER — ONDANSETRON 8 MG PO TBDP
8.0000 mg | ORAL_TABLET | Freq: Once | ORAL | Status: AC
  Administered 2020-02-09: 8 mg via ORAL
  Filled 2020-02-09: qty 1

## 2020-02-09 MED ORDER — METHOCARBAMOL 500 MG PO TABS: ORAL_TABLET | ORAL | 0 refills | Status: DC

## 2020-02-09 MED ORDER — FAMOTIDINE 20 MG PO TABS
20.0000 mg | ORAL_TABLET | Freq: Once | ORAL | Status: AC
  Administered 2020-02-09: 20 mg via ORAL
  Filled 2020-02-09: qty 1

## 2020-02-09 NOTE — ED Provider Notes (Signed)
[facility] EMERGENCY DEPARTMENT Provider Note   CSN: [id number] Arrival date & time: 02/08/20  2254   Time seen 4:38 AM  History Chief Complaint  Patient presents with  . Chest Pain    Mackenzie Harris is a 30 y.o. female.  HPI   Patient states January 22 she had a tenseness in her posterior shoulders bilaterally and had a mild cough at night.  It continued the following day.  On January 24 the pain increased in her shoulders and she also had some pain in her lower back that swelling around her sides.  She also states she has been "battling gas".  She started having a "weird" feeling in her chest tonight about 6 PM.  She states it got worse when she laid down.  She states she had pain in her left chest that swung around her left lateral chest and her back.  She states her left upper arm between her elbow and her shoulder is throbbing and feels heavy.  When she coughs she feels an achiness in her chest.  She denies fever or productive cough.  But she states sometimes when she coughs she feels like she is going to vomit.  She denies any acid reflux.  She denies sore throat or rhinorrhea but has sniffles.  She states she gets nauseated before she coughs and then it goes away.  She states his morning for 4 hours she had frequent soft stools that were not diarrhea.  She states her mom who she lives with has been feeling bad but her Covid test was negative.  He has been treated for it tubo-ovarian abscess and finished her antibiotics last week.  She also states she had pneumonia several years ago and this feels differently.  PCP Patient, No Pcp Per   Past Medical History:  Diagnosis Date  . Allergy   . Anxiety   . BV (bacterial vaginosis) 03/16/2015  . Chlamydia   . Contraceptive management 06/02/2013  . GERD (gastroesophageal reflux disease)   . History of abnormal cervical Pap smear 02/28/2015  . History of chlamydia 04/17/2015  . History of trichomoniasis 04/17/2015  . Ovarian cyst   .  Trichimoniasis   . Vaginal discharge 06/09/2014  . Vaginal itching 06/09/2014  . Vaginal Pap smear, abnormal   . Yeast infection 06/09/2014    Patient Active Problem List   Diagnosis Date Noted  . TOA (tubo-ovarian abscess) 10/09/2019  . History of chlamydia 04/17/2015  . History of trichomoniasis 04/17/2015  . BV (bacterial vaginosis) 03/16/2015  . History of abnormal cervical Pap smear 02/28/2015  . Vaginal itching 06/09/2014  . Vaginal discharge 06/09/2014  . Yeast infection 06/09/2014  . Contraceptive management 06/02/2013    Past Surgical History:  Procedure Laterality Date  . FOOT SURGERY       OB History    Gravida  0   Para  0   Term  0   Preterm  0   AB  0   Living  0     SAB  0   IAB  0   Ectopic  0   Multiple  0   Live Births              Family History  Problem Relation Age of Onset  . Diabetes Mother        borderline  . Hypertension Father   . Diabetes Maternal Grandmother   . Hypertension Maternal Grandmother   . Diabetes Maternal Grandfather   . Hypertension  Maternal Grandfather   . Alzheimer's disease Paternal Grandfather     Social History   Tobacco Use  . Smoking status: Never Smoker  . Smokeless tobacco: Never Used  Vaping Use  . Vaping Use: Never used  Substance Use Topics  . Alcohol use: Not Currently    Comment: occasional  . Drug use: No  employed  Home Medications Prior to Admission medications   Medication Sig Start Date End Date Taking? Authorizing Provider  methocarbamol (ROBAXIN) 500 MG tablet Take 1 or 2 po Q 6hrs for muscle pain 02/09/20  Yes Devoria Albe, MD  fluconazole (DIFLUCAN) 150 MG tablet One tab po now and repeat in 3 days. 10/18/19   [place] Bing, MD  folic acid (FOLVITE) 1 MG tablet Take 1 tablet (1 mg total) by mouth daily. Patient not taking: Reported on 04/09/2019 08/28/16   Cyril Mourning A, NP  HYDROcodone-acetaminophen (NORCO/VICODIN) 5-325 MG tablet Take 1-2 tablets by mouth every 4  (four) hours as needed for moderate pain. Patient not taking: Reported on 10/18/2019 10/12/19   [place] Bing, MD  hydroxypropyl methylcellulose / hypromellose (ISOPTO TEARS / GONIOVISC) 2.5 % ophthalmic solution Place 1 drop into both eyes daily as needed for dry eyes.    [provider]  ibuprofen (ADVIL) 600 MG tablet Take 1 tablet (600 mg total) by mouth every 6 (six) hours as needed for mild pain or cramping. 10/12/19   [place] Bing, MD    Allergies    Patient has no known allergies.  Review of Systems   Review of Systems  All other systems reviewed and are negative.   Physical Exam Updated Vital Signs BP (!) 112/94   Pulse 81   Temp 98.1 F (36.7 C) (Oral)   Resp 18   Ht 5\' 5"  (1.651 m)   Wt 113.4 kg   SpO2 100%   BMI 41.60 kg/m   Physical Exam Vitals and nursing note reviewed.  Constitutional:      General: She is not in acute distress.    Appearance: Normal appearance. She is obese. She is not ill-appearing or toxic-appearing.  HENT:     Head: Normocephalic and atraumatic.     Right Ear: External ear normal.     Left Ear: External ear normal.  Eyes:     Extraocular Movements: Extraocular movements intact.     Conjunctiva/sclera: Conjunctivae normal.     Pupils: Pupils are equal, round, and reactive to light.  Cardiovascular:     Rate and Rhythm: Normal rate and regular rhythm.     Pulses: Normal pulses.     Heart sounds: Normal heart sounds.  Pulmonary:     Effort: Pulmonary effort is normal. No respiratory distress.     Breath sounds: Normal breath sounds. No stridor. No wheezing, rhonchi or rales.  Chest:     Chest wall: No tenderness.       Comments: Area that patient feels chest pain is noted Musculoskeletal:        General: Normal range of motion.     Cervical back: Normal range of motion.     Comments: No tenderness noted in her left shoulder or upper arm.  She has good range of motion.  Skin:    General: Skin is warm and dry.   Neurological:     General: No focal deficit present.     Mental Status: She is alert and oriented to person, place, and time.     Cranial Nerves: No cranial nerve deficit.  Psychiatric:        Mood and Affect: Mood normal.        Behavior: Behavior normal.        Thought Content: Thought content normal.     ED Results / Procedures / Treatments   Labs (all labs ordered are listed, but only abnormal results are displayed) Results for orders placed or performed during the hospital encounter of 02/09/20  Basic metabolic panel  Result Value Ref Range   Sodium 137 135 - 145 mmol/L   Potassium 3.7 3.5 - 5.1 mmol/L   Chloride 104 98 - 111 mmol/L   CO2 24 22 - 32 mmol/L   Glucose, Bld 93 70 - 99 mg/dL   BUN 10 6 - 20 mg/dL   Creatinine, Ser 8.67 0.44 - 1.00 mg/dL   Calcium 9.2 8.9 - 61.9 mg/dL   GFR, Estimated >50 >93 mL/min   Anion gap 9 5 - 15  CBC  Result Value Ref Range   WBC 9.3 4.0 - 10.5 K/uL   RBC 4.72 3.87 - 5.11 MIL/uL   Hemoglobin 13.9 12.0 - 15.0 g/dL   HCT 26.7 12.4 - 58.0 %   MCV 91.7 80.0 - 100.0 fL   MCH 29.4 26.0 - 34.0 pg   MCHC 32.1 30.0 - 36.0 g/dL   RDW 99.8 33.8 - 25.0 %   Platelets 286 150 - 400 K/uL   nRBC 0.0 0.0 - 0.2 %  POC urine preg, ED  Result Value Ref Range   Preg Test, Ur NEGATIVE NEGATIVE  Troponin I (High Sensitivity)  Result Value Ref Range   Troponin I (High Sensitivity) <2 <18 ng/L   Laboratory interpretation all normal    EKG EKG Interpretation  Date/Time:  Tuesday February 08 2020 23:10:12 EST Ventricular Rate:  90 PR Interval:  124 QRS Duration: 72 QT Interval:  336 QTC Calculation: 411 R Axis:   93 Text Interpretation: Normal sinus rhythm with sinus arrhythmia Rightward axis Nonspecific ST abnormality Artifact No old tracing to compare Confirmed by Devoria Albe ([id number]) on 02/08/2020 11:26:40 PM   Radiology No results found.  Procedures Procedures    Medications Ordered in ED Medications  ketorolac (TORADOL) 30  MG/ML injection 30 mg (30 mg Intramuscular Given 02/09/20 0512)  ondansetron (ZOFRAN-ODT) disintegrating tablet 8 mg (8 mg Oral Given 02/09/20 0649)  famotidine (PEPCID) tablet 20 mg (20 mg Oral Given 02/09/20 7341)    ED Course  I have reviewed the triage vital signs and the nursing notes.  Pertinent labs & imaging results that were available during my care of the patient were reviewed by me and considered in my medical decision making (see chart for details).    MDM Rules/Calculators/A&P                          Patient drove herself to the ED.  She was given Toradol 30 mg IM.  We are waiting for her delta troponin to be drawn.  I also added a D-dimer.  Patient states she is not on any type of hormones.  She also is agreeable to be tested for Covid.  Patient's delta troponin and D-dimer are pending at change of shift.  Dr. Criss Alvine will check those results and if they are normal I have her discharge papers ready.   Final Clinical Impression(s) / ED Diagnoses Final diagnoses:  Atypical chest pain    Rx / DC Orders ED Discharge Orders  Ordered    methocarbamol (ROBAXIN) 500 MG tablet        02/09/20 0272          Disposition pending  Devoria Albe, MD, Concha Pyo, MD 02/09/20 [phone]

## 2020-02-09 NOTE — Discharge Instructions (Addendum)
Use ice and heat for comfort.  Take the medication as prescribed.  Check your Covid test later today or tomorrow.  If you have a positive test you should quarantine for the next 5 days.  If you are still having symptoms after 5 days she should quarantine an additional 5 days.  If your test is positive start taking zinc 50 mg a day, vitamin D and vitamin C, and aspirin 81 mg daily.  Recheck if you get shortness of breath or struggle to breathe.

## 2020-02-09 NOTE — ED Provider Notes (Signed)
Care transferred to me.  Patient's second troponin and D-dimer are both negative.  Unclear exact cause of symptoms but she is well-appearing and appears stable for discharge home with return precautions.   Pricilla Loveless, MD 02/09/20 [phone]

## 2020-02-10 LAB — SARS CORONAVIRUS 2 (TAT 6-24 HRS): SARS Coronavirus 2: NEGATIVE

## 2020-02-14 ENCOUNTER — Ambulatory Visit
Admission: RE | Admit: 2020-02-14 | Discharge: 2020-02-14 | Disposition: A | Discharge status: Home / Self Care | Payer: 59 | Source: Ambulatory Visit | Attending: Obstetrics and Gynecology | Admitting: Obstetrics and Gynecology

## 2020-02-14 ENCOUNTER — Other Ambulatory Visit: Payer: Self-pay | Admitting: Obstetrics and Gynecology

## 2020-02-14 ENCOUNTER — Other Ambulatory Visit: Payer: Self-pay

## 2020-02-14 DIAGNOSIS — N7093 Salpingitis and oophoritis, unspecified: Secondary | ICD-10-CM | POA: Diagnosis not present

## 2020-02-22 ENCOUNTER — Telehealth: Payer: Self-pay | Admitting: Obstetrics and Gynecology

## 2020-02-22 NOTE — Telephone Encounter (Signed)
GYN Telephone Note  Patient called re: u/s results. 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   I told her that she can do exp management but risk of recurrent hospitalization due to infection; she's had two of these in the past. I told her that surgically there isn't really that can be done. I told her that can see what IR thinks about the left side but chance they won't be able to drain and risk of reaccumulation. I told her that surgery on these would involve removal of both tubes which would mean in order to conceive she would have to do IVF.  I also told her of risk of open procedure, damage to surrounding structures given past infections She states that she is aware of these issues and wants to have both tubes removed.  Surgery emailed for l/s bilateral salpingectomy, possible open procedure.   Cornelia Copa MD Attending Center for [employer] (Faculty Practice) 02/21/2020

## 2020-03-01 ENCOUNTER — Other Ambulatory Visit: Payer: Self-pay | Admitting: Obstetrics and Gynecology

## 2020-03-02 ENCOUNTER — Encounter (HOSPITAL_BASED_OUTPATIENT_CLINIC_OR_DEPARTMENT_OTHER): Payer: Self-pay | Admitting: Obstetrics and Gynecology

## 2020-03-02 ENCOUNTER — Other Ambulatory Visit: Payer: Self-pay

## 2020-03-02 NOTE — Progress Notes (Signed)
Spoke w/ via phone for pre-op interview---pt Lab needs dos---cbc T & S urine preg-               Lab results------none COVID test ------03-04-2020 1000 Arrive at -------1100 am 03-08-2020 NPO after MN NO Solid Food.  Clear liquids from MN until---1000 am then npo Medications to take morning of surgery -----omeprazole Diabetic medication -----n/a Patient Special Instructions -----pt given overnight stay instructions Pre-Op special Istructions -----none Patient verbalized understanding of instructions that were given at this phone interview. Patient denies shortness of breath, chest pain, fever, cough at this phone interview.

## 2020-03-04 ENCOUNTER — Other Ambulatory Visit (HOSPITAL_COMMUNITY): Payer: 59

## 2020-03-04 ENCOUNTER — Other Ambulatory Visit (HOSPITAL_COMMUNITY)
Admission: RE | Admit: 2020-03-04 | Discharge: 2020-03-04 | Disposition: A | Discharge status: Home / Self Care | Payer: 59 | Source: Ambulatory Visit | Attending: Obstetrics and Gynecology | Admitting: Obstetrics and Gynecology

## 2020-03-04 DIAGNOSIS — Z01812 Encounter for preprocedural laboratory examination: Secondary | ICD-10-CM | POA: Diagnosis not present

## 2020-03-04 DIAGNOSIS — Z20822 Contact with and (suspected) exposure to covid-19: Secondary | ICD-10-CM | POA: Insufficient documentation

## 2020-03-04 LAB — SARS CORONAVIRUS 2 (TAT 6-24 HRS): SARS Coronavirus 2: NEGATIVE

## 2020-03-08 ENCOUNTER — Ambulatory Visit (HOSPITAL_BASED_OUTPATIENT_CLINIC_OR_DEPARTMENT_OTHER)
Admission: RE | Admit: 2020-03-08 | Discharge: 2020-03-08 | Disposition: A | Discharge status: Home / Self Care | Payer: 59 | Attending: Obstetrics and Gynecology | Admitting: Obstetrics and Gynecology

## 2020-03-08 ENCOUNTER — Encounter: Payer: Self-pay | Admitting: Obstetrics and Gynecology

## 2020-03-08 ENCOUNTER — Ambulatory Visit (HOSPITAL_BASED_OUTPATIENT_CLINIC_OR_DEPARTMENT_OTHER): Payer: 59 | Admitting: Anesthesiology

## 2020-03-08 ENCOUNTER — Encounter (HOSPITAL_BASED_OUTPATIENT_CLINIC_OR_DEPARTMENT_OTHER)
Admission: RE | Disposition: A | Discharge status: Home / Self Care | Payer: Self-pay | Source: Home / Self Care | Attending: Obstetrics and Gynecology

## 2020-03-08 DIAGNOSIS — N801 Endometriosis of ovary: Secondary | ICD-10-CM | POA: Insufficient documentation

## 2020-03-08 DIAGNOSIS — N7093 Salpingitis and oophoritis, unspecified: Secondary | ICD-10-CM

## 2020-03-08 DIAGNOSIS — N809 Endometriosis, unspecified: Secondary | ICD-10-CM | POA: Diagnosis present

## 2020-03-08 DIAGNOSIS — Z9889 Other specified postprocedural states: Secondary | ICD-10-CM

## 2020-03-08 DIAGNOSIS — N736 Female pelvic peritoneal adhesions (postinfective): Secondary | ICD-10-CM

## 2020-03-08 DIAGNOSIS — N7011 Chronic salpingitis: Secondary | ICD-10-CM

## 2020-03-08 DIAGNOSIS — N80129 Deep endometriosis of ovary, unspecified ovary: Secondary | ICD-10-CM | POA: Diagnosis present

## 2020-03-08 HISTORY — DX: Presence of spectacles and contact lenses: Z97.3

## 2020-03-08 HISTORY — PX: LAPAROSCOPIC BILATERAL SALPINGECTOMY: SHX5889

## 2020-03-08 LAB — CBC
HCT: 44.5 % (ref 36.0–46.0)
Hemoglobin: 14.7 g/dL (ref 12.0–15.0)
MCH: 29.3 pg (ref 26.0–34.0)
MCHC: 33 g/dL (ref 30.0–36.0)
MCV: 88.8 fL (ref 80.0–100.0)
Platelets: 328 10*3/uL (ref 150–400)
RBC: 5.01 MIL/uL (ref 3.87–5.11)
RDW: 13.8 % (ref 11.5–15.5)
WBC: 9 10*3/uL (ref 4.0–10.5)
nRBC: 0 % (ref 0.0–0.2)

## 2020-03-08 LAB — POCT PREGNANCY, URINE: Preg Test, Ur: NEGATIVE

## 2020-03-08 LAB — TYPE AND SCREEN
ABO/RH(D): A POS
Antibody Screen: NEGATIVE

## 2020-03-08 LAB — ABO/RH: ABO/RH(D): A POS

## 2020-03-08 SURGERY — SALPINGECTOMY, BILATERAL, LAPAROSCOPIC
Anesthesia: General | Site: Abdomen

## 2020-03-08 MED ORDER — SUGAMMADEX SODIUM 200 MG/2ML IV SOLN
INTRAVENOUS | Status: DC | PRN
  Administered 2020-03-08: 200 mg via INTRAVENOUS

## 2020-03-08 MED ORDER — KETOROLAC TROMETHAMINE 30 MG/ML IJ SOLN
INTRAMUSCULAR | Status: AC
  Filled 2020-03-08: qty 1

## 2020-03-08 MED ORDER — ROCURONIUM BROMIDE 100 MG/10ML IV SOLN
INTRAVENOUS | Status: DC | PRN
  Administered 2020-03-08: 10 mg via INTRAVENOUS
  Administered 2020-03-08: 70 mg via INTRAVENOUS

## 2020-03-08 MED ORDER — LIDOCAINE HCL (PF) 2 % IJ SOLN
INTRAMUSCULAR | Status: AC
  Filled 2020-03-08: qty 5

## 2020-03-08 MED ORDER — OXYCODONE HCL 5 MG PO TABS: 5.0000 mg | ORAL_TABLET | Freq: Once | ORAL | Status: DC | PRN

## 2020-03-08 MED ORDER — SILVER NITRATE-POT NITRATE 75-25 % EX MISC
CUTANEOUS | Status: DC | PRN
  Administered 2020-03-08: 7

## 2020-03-08 MED ORDER — OXYCODONE-ACETAMINOPHEN 5-325 MG PO TABS: 1.0000 | ORAL_TABLET | Freq: Four times a day (QID) | ORAL | 0 refills | Status: DC | PRN

## 2020-03-08 MED ORDER — FENTANYL CITRATE (PF) 100 MCG/2ML IJ SOLN
INTRAMUSCULAR | Status: AC
  Filled 2020-03-08: qty 2

## 2020-03-08 MED ORDER — AMISULPRIDE (ANTIEMETIC) 5 MG/2ML IV SOLN: 10.0000 mg | Freq: Once | INTRAVENOUS | Status: DC | PRN

## 2020-03-08 MED ORDER — FENTANYL CITRATE (PF) 100 MCG/2ML IJ SOLN: 25.0000 ug | INTRAMUSCULAR | Status: DC | PRN

## 2020-03-08 MED ORDER — MIDAZOLAM HCL 5 MG/5ML IJ SOLN
INTRAMUSCULAR | Status: DC | PRN
  Administered 2020-03-08: 2 mg via INTRAVENOUS

## 2020-03-08 MED ORDER — BUPIVACAINE HCL 0.5 % IJ SOLN
INTRAMUSCULAR | Status: DC | PRN
  Administered 2020-03-08: 16 mL

## 2020-03-08 MED ORDER — LIDOCAINE HCL (CARDIAC) PF 100 MG/5ML IV SOSY
PREFILLED_SYRINGE | INTRAVENOUS | Status: DC | PRN
  Administered 2020-03-08: 100 mg via INTRAVENOUS

## 2020-03-08 MED ORDER — MIDAZOLAM HCL 2 MG/2ML IJ SOLN
INTRAMUSCULAR | Status: AC
  Filled 2020-03-08: qty 2

## 2020-03-08 MED ORDER — ROCURONIUM BROMIDE 10 MG/ML (PF) SYRINGE
PREFILLED_SYRINGE | INTRAVENOUS | Status: AC
  Filled 2020-03-08: qty 10

## 2020-03-08 MED ORDER — ONDANSETRON HCL 4 MG/2ML IJ SOLN: 4.0000 mg | Freq: Once | INTRAMUSCULAR | Status: DC | PRN

## 2020-03-08 MED ORDER — ONDANSETRON HCL 4 MG/2ML IJ SOLN
INTRAMUSCULAR | Status: DC | PRN
  Administered 2020-03-08: 4 mg via INTRAVENOUS

## 2020-03-08 MED ORDER — DEXAMETHASONE SODIUM PHOSPHATE 4 MG/ML IJ SOLN
INTRAMUSCULAR | Status: DC | PRN
  Administered 2020-03-08: 10 mg via INTRAVENOUS

## 2020-03-08 MED ORDER — KETOROLAC TROMETHAMINE 30 MG/ML IJ SOLN
INTRAMUSCULAR | Status: DC | PRN
  Administered 2020-03-08: 30 mg via INTRAVENOUS

## 2020-03-08 MED ORDER — OXYCODONE HCL 5 MG/5ML PO SOLN: 5.0000 mg | Freq: Once | ORAL | Status: DC | PRN

## 2020-03-08 MED ORDER — PROPOFOL 10 MG/ML IV BOLUS
INTRAVENOUS | Status: AC
  Filled 2020-03-08: qty 40

## 2020-03-08 MED ORDER — LACTATED RINGERS IV SOLN: INTRAVENOUS | Status: DC

## 2020-03-08 MED ORDER — PROPOFOL 10 MG/ML IV BOLUS
INTRAVENOUS | Status: DC | PRN
  Administered 2020-03-08: 150 mg via INTRAVENOUS
  Administered 2020-03-08: 30 mg via INTRAVENOUS
  Administered 2020-03-08: 50 mg via INTRAVENOUS
  Administered 2020-03-08: 20 mg via INTRAVENOUS
  Administered 2020-03-08 (×2): 50 mg via INTRAVENOUS

## 2020-03-08 MED ORDER — FENTANYL CITRATE (PF) 100 MCG/2ML IJ SOLN
INTRAMUSCULAR | Status: DC | PRN
  Administered 2020-03-08 (×6): 50 ug via INTRAVENOUS

## 2020-03-08 SURGICAL SUPPLY — 54 items
ADH SKN CLS APL DERMABOND .7 (GAUZE/BANDAGES/DRESSINGS) ×2
APL SWBSTK 6 STRL LF DISP (MISCELLANEOUS) ×2
APPLICATOR COTTON TIP 6 STRL (MISCELLANEOUS) ×2 IMPLANT
APPLICATOR COTTON TIP 6IN STRL (MISCELLANEOUS) ×3
BAG SPEC RTRVL LRG 6X4 10 (ENDOMECHANICALS) ×2
BLADE SURG 15 STRL LF DISP TIS (BLADE) ×2 IMPLANT
BLADE SURG 15 STRL SS (BLADE) ×3
CABLE HIGH FREQUENCY MONO STRZ (ELECTRODE) ×3 IMPLANT
COVER SURGICAL LIGHT HANDLE (MISCELLANEOUS) ×3 IMPLANT
COVER WAND RF STERILE (DRAPES) ×3 IMPLANT
DECANTER SPIKE VIAL GLASS SM (MISCELLANEOUS) ×3 IMPLANT
DEFOGGER SCOPE WARMER CLEARIFY (MISCELLANEOUS) ×3 IMPLANT
DERMABOND ADVANCED (GAUZE/BANDAGES/DRESSINGS) ×1
DERMABOND ADVANCED .7 DNX12 (GAUZE/BANDAGES/DRESSINGS) ×2 IMPLANT
DURAPREP 26ML APPLICATOR (WOUND CARE) ×3 IMPLANT
ELECT REM PT RETURN 9FT ADLT (ELECTROSURGICAL) ×3
ELECTRODE REM PT RTRN 9FT ADLT (ELECTROSURGICAL) ×2 IMPLANT
GAUZE 4X4 16PLY RFD (DISPOSABLE) ×6 IMPLANT
GLOVE BIO SURGEON STRL SZ7 (GLOVE) ×3 IMPLANT
GLOVE BIOGEL PI IND STRL 7.5 (GLOVE) ×2 IMPLANT
GLOVE BIOGEL PI INDICATOR 7.5 (GLOVE) ×1
GLOVE SURG ENC MOIS LTX SZ6 (GLOVE) ×3 IMPLANT
GLOVE SURG ENC MOIS LTX SZ6.5 (GLOVE) ×6 IMPLANT
GLOVE SURG POLYISO LF SZ7 (GLOVE) ×3 IMPLANT
GLOVE SURG UNDER POLY LF SZ6.5 (GLOVE) ×3 IMPLANT
GLOVE SURG UNDER POLY LF SZ7 (GLOVE) ×15 IMPLANT
GLOVE SURG UNDER POLY LF SZ7.5 (GLOVE) ×6 IMPLANT
GOWN STRL REUS W/ TWL LRG LVL3 (GOWN DISPOSABLE) ×4 IMPLANT
GOWN STRL REUS W/TWL LRG LVL3 (GOWN DISPOSABLE) ×12 IMPLANT
IV NS 1000ML (IV SOLUTION) ×3
IV NS 1000ML BAXH (IV SOLUTION) ×2 IMPLANT
KIT TURNOVER CYSTO (KITS) ×3 IMPLANT
LIGASURE VESSEL 5MM BLUNT TIP (ELECTROSURGICAL) ×3 IMPLANT
NS IRRIG 1000ML POUR BTL (IV SOLUTION) ×3 IMPLANT
PACK ABDOMINAL GYN (CUSTOM PROCEDURE TRAY) ×3 IMPLANT
PACK LAPAROSCOPY BASIN (CUSTOM PROCEDURE TRAY) ×3 IMPLANT
PACK TRENDGUARD 450 HYBRID PRO (MISCELLANEOUS) ×2 IMPLANT
PAD ARMBOARD 7.5X6 YLW CONV (MISCELLANEOUS) ×3 IMPLANT
PAD OB MATERNITY 4.3X12.25 (PERSONAL CARE ITEMS) ×3 IMPLANT
POUCH LAPAROSCOPIC INSTRUMENT (MISCELLANEOUS) ×3 IMPLANT
POUCH SPECIMEN RETRIEVAL 10MM (ENDOMECHANICALS) ×3 IMPLANT
SET SUCTION IRRIG HYDROSURG (IRRIGATION / IRRIGATOR) ×3 IMPLANT
SET TUBE SMOKE EVAC HIGH FLOW (TUBING) ×3 IMPLANT
SLEEVE SCD COMPRESS KNEE MED (MISCELLANEOUS) ×3 IMPLANT
SOL PREP POV-IOD 4OZ 10% (MISCELLANEOUS) ×3 IMPLANT
SUT MON AB 4-0 PS1 27 (SUTURE) ×3 IMPLANT
SUT VIC AB 2-0 CT2 27 (SUTURE) ×3 IMPLANT
SUT VICRYL 0 UR6 27IN ABS (SUTURE) ×3 IMPLANT
SYSTEM CARTER THOMASON II (TROCAR) ×3 IMPLANT
TOWEL OR 17X26 10 PK STRL BLUE (TOWEL DISPOSABLE) ×6 IMPLANT
TRAY FOLEY W/BAG SLVR 14FR (SET/KITS/TRAYS/PACK) ×3 IMPLANT
TRENDGUARD 450 HYBRID PRO PACK (MISCELLANEOUS) ×3
TROCAR 5M 150ML BLDLS (TROCAR) ×6 IMPLANT
TROCAR BALLN 12MMX100 BLUNT (TROCAR) ×3 IMPLANT

## 2020-03-08 NOTE — Anesthesia Preprocedure Evaluation (Signed)
Anesthesia Evaluation  Patient identified by MRN, date of birth, ID band Patient awake    Reviewed: Allergy & Precautions, NPO status , Patient's Chart, lab work & pertinent test results  History of Anesthesia Complications Negative for: history of anesthetic complications  Airway Mallampati: II  TM Distance: >3 FB Neck ROM: Full    Dental   Pulmonary neg pulmonary ROS,    Pulmonary exam normal        Cardiovascular negative cardio ROS Normal cardiovascular exam     Neuro/Psych negative neurological ROS  negative psych ROS   GI/Hepatic Neg liver ROS, GERD  ,  Endo/Other  Morbid obesity  Renal/GU negative Renal ROS  negative genitourinary   Musculoskeletal negative musculoskeletal ROS (+)   Abdominal   Peds  Hematology negative hematology ROS (+)   Anesthesia Other Findings   Reproductive/Obstetrics                             Anesthesia Physical Anesthesia Plan  ASA: II  Anesthesia Plan: General   Post-op Pain Management:    Induction: Intravenous  PONV Risk Score and Plan: 4 or greater and Ondansetron, Dexamethasone, Treatment may vary due to age or medical condition and Midazolam  Airway Management Planned: Oral ETT  Additional Equipment: None  Intra-op Plan:   Post-operative Plan: Extubation in OR  Informed Consent: I have reviewed the patients History and Physical, chart, labs and discussed the procedure including the risks, benefits and alternatives for the proposed anesthesia with the patient or authorized representative who has indicated his/her understanding and acceptance.     Dental advisory given  Plan Discussed with:   Anesthesia Plan Comments:         Anesthesia Quick Evaluation

## 2020-03-08 NOTE — Anesthesia Postprocedure Evaluation (Signed)
Anesthesia Post Note  Patient: Mackenzie Harris  Procedure(s) Performed: LAPAROSCOPIC BILATERAL SALPINGECTOMY, DRAINAGE OF LEFT OVARIAN CYST (Bilateral Abdomen)     Patient location during evaluation: PACU Anesthesia Type: General Level of consciousness: awake and alert Pain management: pain level controlled Vital Signs Assessment: post-procedure vital signs reviewed and stable Respiratory status: spontaneous breathing, nonlabored ventilation and respiratory function stable Cardiovascular status: blood pressure returned to baseline and stable Postop Assessment: no apparent nausea or vomiting Anesthetic complications: no   No complications documented.  Last Vitals:  Vitals:   03/08/20 1515 03/08/20 1545  BP: 124/79 130/88  Pulse: 64 78  Resp: 16 18  Temp:  36.8 C  SpO2: 100% 98%    Last Pain:  Vitals:   03/08/20 1545  TempSrc:   PainSc: 0-No pain                 [name]

## 2020-03-08 NOTE — Transfer of Care (Signed)
Immediate Anesthesia Transfer of Care Note  Patient: Mackenzie Harris  Procedure(s) Performed: Procedure(s) (LRB): LAPAROSCOPIC BILATERAL SALPINGECTOMY, DRAINAGE OF LEFT OVARIAN CYST (Bilateral)  Patient Location: PACU  Anesthesia Type: General  Level of Consciousness: awake, sedated, patient cooperative and responds to stimulation  Airway & Oxygen Therapy: Patient Spontanous Breathing and Patient connected to [place] 02 and soft FM   Post-op Assessment: Report given to PACU RN, Post -op Vital signs reviewed and stable and Patient moving all extremities  Post vital signs: Reviewed and stable  Complications: No apparent anesthesia complications

## 2020-03-08 NOTE — Anesthesia Procedure Notes (Signed)
Procedure Name: Intubation Date/Time: 03/08/2020 12:35 PM Performed by: Justice Rocher, CRNA Pre-anesthesia Checklist: Patient identified, Emergency Drugs available, Suction available, Patient being monitored and Timeout performed Patient Re-evaluated:Patient Re-evaluated prior to induction Oxygen Delivery Method: Circle system utilized Preoxygenation: Pre-oxygenation with 100% oxygen Induction Type: IV induction Ventilation: Mask ventilation without difficulty Laryngoscope Size: Mac and 4 Grade View: Grade II Tube type: Oral Tube size: 7.5 mm Number of attempts: 1 Airway Equipment and Method: Stylet and Oral airway Placement Confirmation: ETT inserted through vocal cords under direct vision,  positive ETCO2,  breath sounds checked- equal and bilateral and CO2 detector Secured at: 23 cm Tube secured with: Tape Dental Injury: Teeth and Oropharynx as per pre-operative assessment

## 2020-03-08 NOTE — Discharge Instructions (Addendum)
Laparoscopic Surgery Discharge Instructions   The following list should answer your most common questions.  Although we will discuss your surgery and post-operative instructions with you prior to your discharge, this list will serve as a reminder if you fail to recall the details of what we discussed.  We will discuss your surgery once again in detail at your post-op visit in two to four weeks. If you haven't already done so, please call to make your appointment as soon as possible.  How you will feel: Although you have just undergone a major surgery, your recovery will be significantly shorter since the surgery was performed through much smaller incisions than the traditional approach.  You should feel slightly better each day.  If you suddenly feel much worse than the prior day, please call the clinic.  It's important during the early part of your recovery that you maintain some activity.  Walking is encouraged.  You will quicken your recovery by continued activity.  Incision:  Your incisions will be closed with dissolvable stitches or surgical adhesive (glue).  There may be Band-aids and/or Steri-strips covering your incisions.  If there is no drainage from the incisions you may remove the Band-aids in one to two days.  You may notice some minor bruising at the incision sites.  This is common and will resolve within several days.  Please inform us if the redness at the edges of your incision appears to be spreading.  If the skin around your incision becomes warm to the touch, or if you notice a pus-like drainage, please call the office.  Stairs/Driving/Activities: You may climb stairs if necessary.  If you've had general anesthesia, do not drive a car the rest of the day today.  You may begin light housework when you feel up to it, but avoid heavy lifting (more than 15-20lbs) or pushing until cleared for these activities by your physician.  Hygiene:  Do not soak your incisions.  Showers are acceptable  but you may not take a bath or swim in a pool.  Cleanse your incisions daily with soap and water.  Medications:  Please resume taking any medications that you were taking prior to the surgery.  If we have prescribed any new medications for you, please take them as directed.  Constipation:  It is fairly common to experience some difficulty in moving your bowels following major surgery.  Being active will help to reduce this likelihood. A diet rich in fiber and plenty of liquids is desirable.  If you do become constipated, a mild laxative such as Miralax, Milk of Magnesia, or Metamucil, or a stool softener such as Colace, is recommended.  General Instructions: If you develop a fever of 100.5 degrees or higher, please call the office number(s) below for physician on call.    Post Anesthesia Home Care Instructions  Activity: Get plenty of rest for the remainder of the day. A responsible individual must stay with you for 24 hours following the procedure.  For the next 24 hours, DO NOT: -Drive a car -Advertising copywriter -Drink alcoholic beverages -Take any medication unless instructed by your physician -Make any legal decisions or sign important papers.  Meals: Start with liquid foods such as gelatin or soup. Progress to regular foods as tolerated. Avoid greasy, spicy, heavy foods. If nausea and/or vomiting occur, drink only clear liquids until the nausea and/or vomiting subsides. Call your physician if vomiting continues.  Special Instructions/Symptoms: Your throat may feel dry or sore from the anesthesia or  the breathing tube placed in your throat during surgery. If this causes discomfort, gargle with warm salt water. The discomfort should disappear within 24 hours.

## 2020-03-08 NOTE — Brief Op Note (Signed)
03/08/2020  2:17 PM  PATIENT:  Mackenzie Harris  30 y.o. female  PRE-OPERATIVE DIAGNOSIS:  Bilateral hydrosalpinges. H/o TOAs  POST-OPERATIVE DIAGNOSIS:  Same. Stage 4 endometriosis. Left endometrioma  PROCEDURE:  Procedure(s): LAPAROSCOPIC BILATERAL SALPINGECTOMY, DRAINAGE OF LEFT OVARIAN CYST (Bilateral)  SURGEON:  Surgeon(s) and Role:    * [place] Bing, MD - Primary    * Conan Bowens, MD - Assisting  ANESTHESIA:   local and general  EBL:  50 mL   BLOOD ADMINISTERED:none  DRAINS: indwelling foley UOP 21mL   LOCAL MEDICATIONS USED:  MARCAINE     SPECIMEN:  Bilateral tubes to pathology  DISPOSITION OF SPECIMEN:  PATHOLOGY  COUNTS:  YES  TOURNIQUET:  * No tourniquets in log *  DICTATION: .Note written in EPIC  PLAN OF CARE: Discharge to home after PACU  PATIENT DISPOSITION:  PACU - hemodynamically stable.   Delay start of Pharmacological VTE agent (>24hrs) due to surgical blood loss or risk of bleeding: not applicable  Cornelia Copa MD Attending Center for [facility] (Faculty Practice) 03/08/2020 Time: [phone]

## 2020-03-08 NOTE — Op Note (Addendum)
Operative Note   03/08/2020  PRE-OP DIAGNOSIS *Bilateral hydrosalpinges. History of tubo-ovarian abscesses   POST-OP DIAGNOSIS *Stage 4 endometriosis *Left endometrioma *Bilateral hydrosalpinges *Moderate pelvic adhesive disease  *BMI 40  SURGEON: Surgeon(s) and Role:    * [employer] Bing, MD - Primary  ASSISTANT:    * Conan Bowens, MD - Assisting  An experienced assistant was required given the standard of surgical care given the complexity of the case. This assistant was needed for exposure, dissection, suctioning, retraction, instrument exchange, and for overall help during the procedure.   PROCEDURE: Diagnostic laparoscopy, bilateral salpingectomy, drainage of left endometrioma, lysis of adhesions x 30 minutes  ANESTHESIA: General and local  ESTIMATED BLOOD LOSS: 34mL  DRAINS: indwelling foley 29mL UOP   TOTAL IV FLUIDS: crystalloid  VTE PROPHYLAXIS: SCDs to the bilateral lower extremities  ANTIBIOTICS: Not indicated  SPECIMENS: bilateral fallopian tubes  DISPOSITION: PACU - hemodynamically stable.  CONDITION: stable  COMPLICATIONS: pelvic adhesive disease  FINDINGS: Normal stomach edge, liver edge, gallbladder edge and no abdominal adhesions.   In the pelvis, there were slight adhesions at the area of the pelvic brim and transition to the sigmoid colon.  The bilateral tubes were abnormal and appeared to be engorged approximately 1.5 on the right and 2-3cm on the left, in their widest points.  The right ovary was grossly normal but was densely adhesed in the right posterior cul-de-sac. The whole posterior cul-de-sac was full of wispy, thin and thick adhesions.  The left ovary was enlarged approximatley 3-4 cm.  It was also densely adhesed to the left cul de sac and para ovarian fossa.  The left ovary appeared to have a simple appearing cyst, and during cystectomy it was discovered that it was an endometrioma with approximately 41mL of fluid.   Throughout the  pelvis dark brown spots and powder spots, especially at the posterior cul de sac, were seen consistent with endometriosis.   DESCRIPTION OF PROCEDURE: After informed consent was obtained, the patient was taken to the operating room where anesthesia was obtained without difficulty. The patient was positioned in the dorsal lithotomy position in [place] stirrups and her arms were carefully tucked at her sides and the usual precautions were taken.  She was prepped and draped in normal sterile fashion.  Time-out was performed and a Foley catheter was placed into the bladder. A standard Hulka uterine manipulator was then placed in the uterus without incident. Gloves were then changed, and after injection of local anesthesia, the open technique was used to place an infraumbilical 12-mm baloon trocar under direct visualization. The laparoscope was introduced and CO2 gas was infused for pneumoperitoneum to a pressure of 15 mm Hg and the area below inspected for injury.  The patient was placed in Trendelenburg and the bowel was displaced up into the upper abdomen, and the right and left lateral 5-mm ports were placed under direct visualization of the laparoscope, after injection of local anesthesia.    The right tube was mobile and able to be easily removed with the Ligasure lysing filmy and thin adhesions by freeing it up more. Next, the mesosalpingx was serially cauterized and at the cornua; the cornua was serially cauterized and then cut, leaving a cauterized pedicle.  This was removed via the umbilical port. The left tube was not easily identified because it was wrapped around the left ovary and the whole adnexa was stuck in the left paraovarian fossa and cul de sac. This was freed up but using the Ligasure  and blunt dissection on the adhesions, staying well away from the bowel.  The left tube became free enough to serially cauterize and cut the mesosalpingx and then cut at the cornua, leaving a cauterized pedicle.  An  endocatch bag had to be placed through the umbilical port for its removal.  The left ovary was noted to be enlarged and was freed up more from the fossa with the Ligasure; the ureter was traced down into the lower pelvis and was noted to be in its proper location. A cystectomy was attempted by using the monopoloar scissors on cut to cut down to the cyst was. This was then freed from the overlying ovarian tissue.  When trying to free the posterior aspect of the cyst and ovary, I was unable to do this because I was strongly considering a left oophorectomy but would have been unable to do this safely because it was very densely adherent to the fossa and cul de sac in the area of the ureter.  I tried to remove the cyst but it was adherent and it then ruptured, spilling the endometrioma fluid.  I was unable to remove any of the cyst wall because it was so stuck to the wall and caused oozing. All of endometrioma fluid was expressed and the suction irrigator used to remove the fluid and to irrigate copiously and irrigation removed. I cauterized the cyst dissection area, lowered the pressure to and adequate hemostasis noted.  The umbilical port was removed and the Shellia Carwin II device placed and used to close the fascia at the umbilical port with 0- vicryl. The lateral trocars were removed under visualization.  The skin incision at the umbilicus was closed with a subcuticular stitch of 4-0 monocryl.  The remaining skin incisions were closed with Dermabond glue.  The patient tolerated the procedure well.  Sponge, lap and needle counts were correct x2.  The patient was taken to recovery room in excellent condition.  Cornelia Copa MD Attending Center for [employer] [facility])

## 2020-03-08 NOTE — H&P (Addendum)
Obstetrics & Gynecology Surgical H&P   Date of Surgery: 03/08/2020    Primary OBGYN: [facility]  Reason for Admission: scheduled surgery  History of Present Illness: Mackenzie Harris is a 30 y.o. G0P0000 (Patient's last menstrual period was 01/29/2020.), with the above CC. PMHx is significant for BMI 40, h/o recurrent TOAs.  Patient last admitted at the end of September for [facility] and was treated with IV and PO abx. Repeat imaging showed persistent TOA/hydrosalpingx, which has been present since at least middle 2020.  Options d/w patient and she desired definitive surgical management with removal of the abnormal fallopian tubes with patient aware this would mean IVF needed for children.   ROS: A 12-point review of systems was performed and negative, except as stated in the above HPI.  OBGYN History: As per HPI. OB History  Gravida Para Term Preterm AB Living  0 0 0 0 0 0  SAB IAB Ectopic Multiple Live Births  0 0 0 0       Past Medical History: Past Medical History:  Diagnosis Date  . Anxiety   . BV (bacterial vaginosis) 03/16/2015  . GERD (gastroesophageal reflux disease)   . History of abnormal cervical Pap smear 02/28/2015  . History of chlamydia 04/17/2015  . History of trichomoniasis 04/17/2015  . Ovarian cyst   . TOA (tubo-ovarian abscess) 10/09/2019  . Wears glasses   . Yeast infection 06/09/2014    Past Surgical History: Past Surgical History:  Procedure Laterality Date  . FOOT SURGERY Left 2011    Family History:  Family History  Problem Relation Age of Onset  . Diabetes Mother        borderline  . Hypertension Father   . Diabetes Maternal Grandmother   . Hypertension Maternal Grandmother   . Diabetes Maternal Grandfather   . Hypertension Maternal Grandfather   . Alzheimer's disease Paternal Grandfather    Social History:  Social History   Socioeconomic History  . Marital status: Single    Spouse name: Not on file  . Number of children: Not on file   . Years of education: Not on file  . Highest education level: Not on file  Occupational History  . Not on file  Tobacco Use  . Smoking status: Never Smoker  . Smokeless tobacco: Never Used  Vaping Use  . Vaping Use: Never used  Substance and Sexual Activity  . Alcohol use: Not Currently  . Drug use: No  . Sexual activity: Yes    Birth control/protection: None  Other Topics Concern  . Not on file  Social History Narrative  . Not on file   Social Determinants of Health   Financial Resource Strain: Not on file  Food Insecurity: Not on file  Transportation Needs: Not on file  Physical Activity: Not on file  Stress: Not on file  Social Connections: Not on file  Intimate Partner Violence: Not on file    Allergy: No Known Allergies  Current Outpatient Medications: Medications Prior to Admission  Medication Sig Dispense Refill Last Dose  . Acetaminophen-Caff-Pyrilamine (MIDOL COMPLETE PO) Take by mouth as needed.   03/07/2020 at Unknown time  . ibuprofen (ADVIL) 600 MG tablet Take 1 tablet (600 mg total) by mouth every 6 (six) hours as needed for mild pain or cramping. 30 tablet 0 Past Month at Unknown time  . omeprazole (PRILOSEC) 20 MG capsule Take 20 mg by mouth 2 (two) times daily as needed.   03/08/2020 at Unknown time  . hydroxypropyl  methylcellulose / hypromellose (ISOPTO TEARS / GONIOVISC) 2.5 % ophthalmic solution Place 1 drop into both eyes daily as needed for dry eyes.   More than a month at Unknown time     Hospital Medications: Current Facility-Administered Medications  Medication Dose Route Frequency Provider Last Rate Last Admin  . lactated ringers infusion   Intravenous Continuous Marcene Duos, MD      . lactated ringers infusion   Intravenous Continuous [place] Bing, MD         Physical Exam:  Current Vital Signs 24h Vital Sign Ranges  T 97.8 F (36.6 C) Temp  Avg: 97.8 F (36.6 C)  Min: 97.8 F (36.6 C)  Max: 97.8 F (36.6 C)  BP (!)  139/100 BP  Min: 139/100  Max: 139/100  HR 80 Pulse  Avg: 80  Min: 80  Max: 80  RR 18 Resp  Avg: 18  Min: 18  Max: 18  SaO2 99 % Room Air SpO2  Avg: 99 %  Min: 99 %  Max: 99 %       24 Hour I/O Current Shift I/O  Time Ins Outs No intake/output data recorded. No intake/output data recorded.    Body mass index is 39.66 kg/m. General appearance: Well nourished, well developed female in no acute distress.  Cardiovascular: S1, S2 normal, no murmur, rub or gallop, regular rate and rhythm Respiratory:  Clear to auscultation bilateral. Normal respiratory effort Abdomen: positive bowel sounds and no masses, hernias; diffusely non tender to palpation, non distended Neuro/Psych:  Normal mood and affect.  Skin:  Warm and dry.  Extremities: no clubbing, cyanosis, or edema.  Lymphatic:  No inguinal lymphadenopathy.   Laboratory: UPT: neg COVID: neg  Imaging:  Narrative & Impression  CLINICAL DATA:  30 year old female presents for follow-up tubo-ovarian abscess. LMP 01/31/2020.  EXAM: ULTRASOUND PELVIS TRANSVAGINAL  TECHNIQUE: Transvaginal ultrasound examination of the pelvis was performed including evaluation of the uterus, ovaries, adnexal regions, and pelvic cul-de-sac.  COMPARISON:  11/30/2019 pelvic sonogram.  FINDINGS: Uterus  Measurements: 7.6 x 3.6 x 4.6 cm = volume: 65 mL. Retroverted retroflexed uterus is normal in size and configuration, with no uterine fibroids or other myometrial abnormalities.  Endometrium  Thickness: 8 mm. No endometrial cavity fluid or focal endometrial mass.  Right ovary  Measurements: 3.4 x 1.9 x 2.1 cm = volume: 6.9 mL. Normal right ovary. Between the right ovary and uterus, there is a thin-walled tubular cystic structure measuring up to 1.3 cm diameter compatible with mild right hydrosalpinx, unchanged. No suspicious right adnexal findings.  Left ovary  Measurements: 4.6 x 3.2 x 4.5 cm = volume: 35 mL. There is a 4.1  x 2.7 x 4.0 cm left ovarian hypoechoic mass with relatively uniform low level echoes and no internal vascularity on color Doppler, previously 4.0 x 2.5 x 3.6 cm on 11/30/2019 sonogram using similar measurement technique, not appreciably changed.  Other findings:  No abnormal free fluid  IMPRESSION: 1. Persistent hypoechoic 4.1 cm left ovarian mass with no internal vascularity, not appreciably changed since 11/30/2019 sonogram. Differential includes endometrioma or residual complex cyst from prior tubo-ovarian abscess. Continued sonographic surveillance is warranted. 2. Chronic mild right hydrosalpinx. 3. Normal retroverted uterus.   Electronically Signed   By: Delbert Phenix M.D.   On: 02/14/2020 09:00     Assessment: Mackenzie Harris is a 30 y.o. G0P0000 (Patient's last menstrual period was 01/29/2020.) here for scheduled surgery. Pt stable  Plan: Options reviewed with her and plan is to  remove any abnormalities from adnexal areas. I told her that if tubes now look completely normal I would favor keeping them, although I don't anticipate that to be the case, but she is strongly in favor of removal of both tubes even if they look normal. Patient aware for IVF need with salpingectomies.  Patient consented for bilateral salpingectomy, all indicated procedures.    Cornelia Copa MD Attending Center for [facility])

## 2020-03-09 ENCOUNTER — Encounter (HOSPITAL_BASED_OUTPATIENT_CLINIC_OR_DEPARTMENT_OTHER): Payer: Self-pay | Admitting: Obstetrics and Gynecology

## 2020-03-09 ENCOUNTER — Telehealth: Payer: Self-pay | Admitting: Obstetrics and Gynecology

## 2020-03-09 LAB — SURGICAL PATHOLOGY

## 2020-03-09 NOTE — Telephone Encounter (Signed)
GYN Telephone Note D/w pt re: operative findings and recommendation to start on progestin suppression therapy. She is amenable to this. Depo provera caused weight gain so recommend aygestin 5mg  po qday and I d/w her that I recommend she stay on this until menopause in early late 40s/early 37s.  I told her that if she has no s/s of endometriosis then nothing else needs to be done  Pt having problems with oxycodne Rx at the pharmacy due to insurance but she states it was approved by her insurance. I told her that if she needs it sent to another pharmacy to let me know  49s MD Attending Center for [facility] (Faculty Practice) 03/09/2020 Time: 0830

## 2020-03-09 NOTE — Discharge Summary (Signed)
Gynecology Discharge Summary Date of Admission: 03/08/2020 Date of Discharge: 03/08/2020  The patient was admitted, as scheduled, and underwent a l/s bilateral salpingectomy, drainage of left ovarian endometrioma; please refer to operative note for full details.  She was meeting all post op goals and discharged to home on POD#0  Allergies as of 03/08/2020   No Known Allergies     Medication List    TAKE these medications   hydroxypropyl methylcellulose / hypromellose 2.5 % ophthalmic solution Commonly known as: ISOPTO TEARS / GONIOVISC Place 1 drop into both eyes daily as needed for dry eyes.   ibuprofen 600 MG tablet Commonly known as: ADVIL Take 1 tablet (600 mg total) by mouth every 6 (six) hours as needed for mild pain or cramping. Notes to patient: Toradol given in surgery at 1355.   MIDOL COMPLETE PO Take by mouth as needed.   omeprazole 20 MG capsule Commonly known as: PRILOSEC Take 20 mg by mouth 2 (two) times daily as needed.   oxyCODONE-acetaminophen 5-325 MG tablet Commonly known as: PERCOCET/ROXICET Take 1-2 tablets by mouth every 6 (six) hours as needed.       Future Appointments  Date Time Provider Department Center  03/27/2020 10:15 AM [place] Bing, MD [facility]    Cornelia Copa MD Attending Center for [facility])

## 2020-03-10 ENCOUNTER — Other Ambulatory Visit: Payer: Self-pay | Admitting: Obstetrics and Gynecology

## 2020-03-10 MED ORDER — NORETHINDRONE ACETATE 5 MG PO TABS: 5.0000 mg | ORAL_TABLET | Freq: Every day | ORAL | 6 refills | Status: DC

## 2020-03-27 ENCOUNTER — Other Ambulatory Visit: Payer: Self-pay

## 2020-03-27 ENCOUNTER — Encounter: Payer: Self-pay | Admitting: Obstetrics and Gynecology

## 2020-03-27 ENCOUNTER — Ambulatory Visit (INDEPENDENT_AMBULATORY_CARE_PROVIDER_SITE_OTHER): Payer: 59 | Admitting: Obstetrics and Gynecology

## 2020-03-27 VITALS — BP 113/73 | HR 78 | Ht 65.5 in | Wt 242.2 lb

## 2020-03-27 DIAGNOSIS — N809 Endometriosis, unspecified: Secondary | ICD-10-CM

## 2020-03-27 DIAGNOSIS — Z09 Encounter for follow-up examination after completed treatment for conditions other than malignant neoplasm: Secondary | ICD-10-CM

## 2020-03-27 NOTE — Progress Notes (Signed)
  Subjective:     Mackenzie Harris is a 30 y.o. female who presents to the clinic s/p 2/23 Diagnostic laparoscopy, bilateral salpingectomy, drainage of left endometrioma, lysis of adhesions x 30 minutes for recurrent bilateral hydrosalpinges, TOA.  Diffuse endometriosis, left endometrioma and moderate pelvic adhesive disease seen. Patient discharged to home from the PACU.    She is doing well and continue to meet all post op goals. She is taking the Aygestin w/o issue   Review of Systems Pertinent items are noted in HPI.    Objective:    BP 113/73   Pulse 78   Ht 5' 5.5" (1.664 m)   Wt 242 lb 3.2 oz (109.9 kg)   BMI 39.69 kg/m  General:  alert  Abdomen: soft, bowel sounds active, non-tender  Incision:   healing well, no drainage, no erythema, no hernia, no seroma, no swelling, no dehiscence, incisions well approximated     Assessment:    Doing well postoperatively. Operative findings again reviewed.   Plan:   Long d/w pt re: operative findings and recommend long term progestin use until menopause (late 40s/early 63s) as best way to keep s/s at bay, potentially reverse it and keep it from getting worse.  Patient is amenable to plan.   RTC: 1 year  Cornelia Copa MD Attending Center for [employer] (Faculty Practice) 03/27/2020

## 2020-03-28 ENCOUNTER — Encounter (HOSPITAL_BASED_OUTPATIENT_CLINIC_OR_DEPARTMENT_OTHER): Payer: Self-pay

## 2020-03-28 ENCOUNTER — Ambulatory Visit (HOSPITAL_BASED_OUTPATIENT_CLINIC_OR_DEPARTMENT_OTHER): Admit: 2020-03-28 | Payer: 59 | Admitting: Obstetrics and Gynecology

## 2020-03-28 SURGERY — SALPINGECTOMY, BILATERAL, LAPAROSCOPIC
Anesthesia: Choice

## 2020-09-01 ENCOUNTER — Ambulatory Visit: Payer: Self-pay

## 2020-09-21 ENCOUNTER — Encounter: Payer: Self-pay | Admitting: Obstetrics and Gynecology

## 2020-09-21 ENCOUNTER — Other Ambulatory Visit (HOSPITAL_COMMUNITY)
Admission: RE | Admit: 2020-09-21 | Discharge: 2020-09-21 | Disposition: A | Discharge status: Home / Self Care | Payer: 59 | Source: Ambulatory Visit | Attending: Obstetrics and Gynecology | Admitting: Obstetrics and Gynecology

## 2020-09-21 ENCOUNTER — Ambulatory Visit (INDEPENDENT_AMBULATORY_CARE_PROVIDER_SITE_OTHER): Payer: 59 | Admitting: Obstetrics and Gynecology

## 2020-09-21 ENCOUNTER — Other Ambulatory Visit: Payer: Self-pay

## 2020-09-21 VITALS — BP 134/91 | HR 90 | Wt 237.0 lb

## 2020-09-21 DIAGNOSIS — N941 Unspecified dyspareunia: Secondary | ICD-10-CM | POA: Insufficient documentation

## 2020-09-21 DIAGNOSIS — Z202 Contact with and (suspected) exposure to infections with a predominantly sexual mode of transmission: Secondary | ICD-10-CM | POA: Diagnosis not present

## 2020-09-21 DIAGNOSIS — Z01419 Encounter for gynecological examination (general) (routine) without abnormal findings: Secondary | ICD-10-CM

## 2020-09-21 DIAGNOSIS — N809 Endometriosis, unspecified: Secondary | ICD-10-CM | POA: Diagnosis not present

## 2020-09-21 DIAGNOSIS — N907 Vulvar cyst: Secondary | ICD-10-CM

## 2020-09-21 DIAGNOSIS — R6882 Decreased libido: Secondary | ICD-10-CM | POA: Insufficient documentation

## 2020-09-21 NOTE — Progress Notes (Signed)
Obstetrics and Gynecology Annual Patient Evaluation  Appointment Date: 09/21/2020  OBGYN Clinic: [facility]  Chief Complaint:  Chief Complaint  Patient presents with   Gynecologic Exam    History of Present Illness: Mackenzie Harris is a 30 y.o. G0 (LMP: none), seen for the above chief complaint. Her past medical history is significant for h/o left endometrioma, s/p l/s bilateral salpingectomy in 2021 for recurrent TOAs.  Dyspareunia: at the introitus at the area of the posterior fourchette which she says has been going on since her l/s surgery last year. No d/c, itching, vaginal dryness and maybe similar s/s with recurrent BV in the past  Lowered libido: since surgery last year; pt unsure if from maybe the Aygestin she's on or maybe from the dyspareunia. Emotionally she said that she's always been fine with having to get the BS done for her recurrent TOAs  Right labia cyst noticed it in the past few weeks, nttp and not causing any issues with sex, etc. . She says that she had it years ago and was told by a GYN that nothing needed to be done and she states it went away on it's own.   Review of Systems: Pertinent items noted in HPI and remainder of comprehensive ROS otherwise negative.     Patient Active Problem List   Diagnosis Date Noted   Pelvic adhesive disease 03/08/2020   Endometriosis determined by laparoscopy 03/08/2020   Left endometrioma 03/08/2020   TOA (tubo-ovarian abscess) 10/09/2019    Past Medical History:  Past Medical History:  Diagnosis Date   Anxiety    BV (bacterial vaginosis) 03/16/2015   GERD (gastroesophageal reflux disease)    History of abnormal cervical Pap smear 02/28/2015   History of chlamydia 04/17/2015   History of trichomoniasis 04/17/2015   Ovarian cyst    TOA (tubo-ovarian abscess) 10/09/2019   Wears glasses    Yeast infection 06/09/2014    Past Surgical History:  Past Surgical History:  Procedure  Laterality Date   FOOT SURGERY Left 2011   LAPAROSCOPIC BILATERAL SALPINGECTOMY Bilateral 03/08/2020   Procedure: LAPAROSCOPIC BILATERAL SALPINGECTOMY, DRAINAGE OF LEFT OVARIAN CYST;  Surgeon: [place] Bing, MD;  Location: [facility];  Service: Gynecology;  Laterality: Bilateral;    Past Obstetrical History:  OB History  Gravida Para Term Preterm AB Living  0 0 0 0 0 0  SAB IAB Ectopic Multiple Live Births  0 0 0 0      Past Gynecological History: As per HPI. Periods: none since being on the aygestin History of Pap Smear(s): Yes.   Last pap 2020, which was negative  Social History:  Social History   Socioeconomic History   Marital status: Single    Spouse name: Not on file   Number of children: Not on file   Years of education: Not on file   Highest education level: Not on file  Occupational History   Not on file  Tobacco Use   Smoking status: Never   Smokeless tobacco: Never  Vaping Use   Vaping Use: Never used  Substance and Sexual Activity   Alcohol use: Not Currently   Drug use: No   Sexual activity: Yes    Birth control/protection: None  Other Topics Concern   Not on file  Social History Narrative   Not on file   Social Determinants of Health   Financial Resource Strain: Not on file  Food Insecurity: No Food Insecurity   Worried About Running Out  of Food in the Last Year: Never true   Ran Out of Food in the Last Year: Never true  Transportation Needs: No Transportation Needs   Lack of Transportation (Medical): No   Lack of Transportation (Non-Medical): No  Physical Activity: Not on file  Stress: Not on file  Social Connections: Not on file  Intimate Partner Violence: Not on file    Family History:  Family History  Problem Relation Age of Onset   Diabetes Mother        borderline   Hypertension Father    Diabetes Maternal Grandmother    Hypertension Maternal Grandmother    Diabetes Maternal Grandfather    Hypertension Maternal  Grandfather    Alzheimer's disease Paternal Grandfather      Medications Mackenzie Harris had no medications administered during this visit. Current Outpatient Medications  Medication Sig Dispense Refill   Acetaminophen-Caff-Pyrilamine (MIDOL COMPLETE PO) Take by mouth as needed.     hydroxypropyl methylcellulose / hypromellose (ISOPTO TEARS / GONIOVISC) 2.5 % ophthalmic solution Place 1 drop into both eyes daily as needed for dry eyes.     norethindrone (AYGESTIN) 5 MG tablet Take 1 tablet (5 mg total) by mouth daily. For stage 4 endometriosis suppression 60 tablet 6   omeprazole (PRILOSEC) 20 MG capsule Take 20 mg by mouth 2 (two) times daily as needed.     No current facility-administered medications for this visit.    Allergies Patient has no known allergies.   Physical Exam:  BP (!) 134/91   Pulse 90   Wt 237 lb (107.5 kg)   BMI 38.84 kg/m  Body mass index is 38.84 kg/m. General appearance: Well nourished, well developed female in no acute distress.  Neck:  Supple, normal appearance, and no thyromegaly  Cardiovascular: normal s1 and s2.  No murmurs, rubs or gallops. Respiratory:  Clear to auscultation bilateral. Normal respiratory effort Abdomen: positive bowel sounds and no masses, hernias; diffusely non tender to palpation, non distended Breasts: breasts appear normal, no suspicious masses, no skin or nipple changes or axillary nodes, and normal palpation. Neuro/Psych:  Normal mood and affect.  Skin:  Warm and dry.  Lymphatic:  No inguinal lymphadenopathy.   Pelvic exam: is not limited by body habitus EGBUS: At the right labia minora at the 9 o'clock position is a 5-29mm, flesh colored nodule that is mobile, nttp and looks like a sebaceous cyst. ?irritation at the posterior fourhcetted Vagina: within normal limits and with no blood or discharge in the vault Cervix: normal appearing cervix without tenderness, discharge or lesions.  Uterus:  nonenlarged and non  tender Adnexa:  normal adnexa and no mass, fullness, tenderness Rectovaginal: deferred  Laboratory: none  Radiology: none  Assessment: pt stabld  Plan:  1. Well woman exam Routine care. Pt decline blood work today. She states she has a pcp appt for later this year but is having diarrhea and constipation back and forth. Recommend eliminating dairy, trying non flavored psyllium fiber and cutting down on processed foods as much as possible in the interim.   2. Dyspareunia F/u swab and if anything + treat and see if that helps. No e/o vaginal dryness but recommend trying lubrication to see if that helps. Potential SE from the aygestin but recommend trying one thing at a time and also would not want to stop the progestin suppression given the degree of endo seen at surgery. Pt would also like to stay on the aygestin given her history and since surgery her abdominal  pain is gone which could be from the aygestin, too. - Cervicovaginal ancillary only( [place]) - Cytology - PAP( [place])  3. H/o endometriosis See above  4. Labia cyst Recommend exp management. If she desires removal or if it gets bigger, pt to let us know and can remove in the office  5. Decreased libido See above. Could be from the dyspareunia. Try and correct that first before trying to switch to a different progestin. Pt on POPs due to desire to not want to gain weight with depo. Could consider doing a 0.35mg  POP instead of aygestin in the future.    RTC PRN  Cornelia Copa MD Attending [facility])

## 2020-09-22 LAB — CERVICOVAGINAL ANCILLARY ONLY
Bacterial Vaginitis (gardnerella): NEGATIVE
Candida Glabrata: NEGATIVE
Candida Vaginitis: NEGATIVE
Chlamydia: NEGATIVE
Comment: NEGATIVE
Comment: NEGATIVE
Comment: NEGATIVE
Comment: NEGATIVE
Comment: NEGATIVE
Comment: NORMAL
Neisseria Gonorrhea: NEGATIVE
Trichomonas: NEGATIVE

## 2020-09-26 LAB — CYTOLOGY - PAP
Comment: NEGATIVE
Diagnosis: NEGATIVE
High risk HPV: NEGATIVE

## 2020-10-05 ENCOUNTER — Ambulatory Visit: Payer: 59 | Admitting: Obstetrics and Gynecology

## 2020-10-28 ENCOUNTER — Ambulatory Visit
Admission: EM | Admit: 2020-10-28 | Discharge: 2020-10-28 | Disposition: A | Discharge status: Home / Self Care | Payer: 59 | Attending: Urgent Care | Admitting: Urgent Care

## 2020-10-28 ENCOUNTER — Other Ambulatory Visit: Payer: Self-pay

## 2020-10-28 DIAGNOSIS — M79674 Pain in right toe(s): Secondary | ICD-10-CM

## 2020-10-28 DIAGNOSIS — L600 Ingrowing nail: Secondary | ICD-10-CM

## 2020-10-28 MED ORDER — NAPROXEN 500 MG PO TABS: 500.0000 mg | ORAL_TABLET | Freq: Two times a day (BID) | ORAL | 0 refills | Status: DC

## 2020-10-28 MED ORDER — CEPHALEXIN 500 MG PO CAPS: 500.0000 mg | ORAL_CAPSULE | Freq: Three times a day (TID) | ORAL | 0 refills | Status: DC

## 2020-10-28 NOTE — ED Triage Notes (Signed)
Pt presents with right toe pain from ingrown toenail

## 2020-10-28 NOTE — ED Provider Notes (Signed)
[facility]   MRN: [id number] DOB: May 23, 1990  Subjective:   Mackenzie Harris is a 30 y.o. female presenting for 3 day history of acute onset persistent and worsening right great toenail pain with swelling and drainage.  Patient has a history of ingrown toenails but has never gotten to this point, has not needed procedural removal or wedge resections in the past.  Does not have a podiatrist.  No current facility-administered medications for this encounter.  Current Outpatient Medications:    Acetaminophen-Caff-Pyrilamine (MIDOL COMPLETE PO), Take by mouth as needed., Disp: , Rfl:    hydroxypropyl methylcellulose / hypromellose (ISOPTO TEARS / GONIOVISC) 2.5 % ophthalmic solution, Place 1 drop into both eyes daily as needed for dry eyes., Disp: , Rfl:    ibuprofen (ADVIL) 600 MG tablet, Take 1 tablet (600 mg total) by mouth every 6 (six) hours as needed for mild pain or cramping., Disp: 30 tablet, Rfl: 0   norethindrone (AYGESTIN) 5 MG tablet, Take 1 tablet (5 mg total) by mouth daily. For stage 4 endometriosis suppression, Disp: 60 tablet, Rfl: 6   omeprazole (PRILOSEC) 20 MG capsule, Take 20 mg by mouth 2 (two) times daily as needed., Disp: , Rfl:    oxyCODONE-acetaminophen (PERCOCET/ROXICET) 5-325 MG tablet, Take 1-2 tablets by mouth every 6 (six) hours as needed. (Patient not taking: No sig reported), Disp: 12 tablet, Rfl: 0   No Known Allergies  Past Medical History:  Diagnosis Date   Anxiety    BV (bacterial vaginosis) 03/16/2015   GERD (gastroesophageal reflux disease)    History of abnormal cervical Pap smear 02/28/2015   History of chlamydia 04/17/2015   History of trichomoniasis 04/17/2015   Ovarian cyst    TOA (tubo-ovarian abscess) 10/09/2019   Wears glasses    Yeast infection 06/09/2014     Past Surgical History:  Procedure Laterality Date   FOOT SURGERY Left 2011   LAPAROSCOPIC BILATERAL SALPINGECTOMY Bilateral 03/08/2020   Procedure: LAPAROSCOPIC BILATERAL  SALPINGECTOMY, DRAINAGE OF LEFT OVARIAN CYST;  Surgeon: [place] Bing, MD;  Location: [facility];  Service: Gynecology;  Laterality: Bilateral;    Family History  Problem Relation Age of Onset   Diabetes Mother        borderline   Hypertension Father    Diabetes Maternal Grandmother    Hypertension Maternal Grandmother    Diabetes Maternal Grandfather    Hypertension Maternal Grandfather    Alzheimer's disease Paternal Grandfather     Social History   Tobacco Use   Smoking status: Never   Smokeless tobacco: Never  Vaping Use   Vaping Use: Never used  Substance Use Topics   Alcohol use: Not Currently   Drug use: No    ROS   Objective:   Vitals: BP 124/88   Pulse 88   Temp 98.9 F (37.2 C)   Resp 18   SpO2 96%   Physical Exam Constitutional:      General: She is not in acute distress.    Appearance: Normal appearance. She is well-developed. She is not ill-appearing.  HENT:     Head: Normocephalic and atraumatic.     Nose: Nose normal.     Mouth/Throat:     Mouth: Mucous membranes are moist.     Pharynx: Oropharynx is clear.  Eyes:     General: No scleral icterus.    Extraocular Movements: Extraocular movements intact.     Pupils: Pupils are equal, round, and reactive to light.  Cardiovascular:  Rate and Rhythm: Normal rate.  Pulmonary:     Effort: Pulmonary effort is normal.  Musculoskeletal:       Feet:  Skin:    General: Skin is warm and dry.  Neurological:     General: No focal deficit present.     Mental Status: She is alert and oriented to person, place, and time.  Psychiatric:        Mood and Affect: Mood normal.        Behavior: Behavior normal.     Assessment and Plan :   PDMP not reviewed this encounter.  1. Great toe pain, right   2. Ingrown right greater toenail    Referral placed to podiatry.  Recommended starting Keflex to address the infected ingrown toenail, naproxen for pain and inflammation.  Warm  soaks for the toe. Counseled patient on potential for adverse effects with medications prescribed/recommended today, ER and return-to-clinic precautions discussed, patient verbalized understanding.    Wallis Bamberg, [place] 10/28/20 [phone]

## 2020-10-28 NOTE — Discharge Instructions (Signed)
[facility], [place], [place], [place] Address: [address] [place], [place] [address] Phone: [phone]

## 2020-11-13 ENCOUNTER — Ambulatory Visit: Payer: 59 | Admitting: Podiatry

## 2020-11-13 ENCOUNTER — Other Ambulatory Visit: Payer: Self-pay

## 2020-11-13 DIAGNOSIS — L600 Ingrowing nail: Secondary | ICD-10-CM

## 2020-11-13 DIAGNOSIS — M79674 Pain in right toe(s): Secondary | ICD-10-CM

## 2020-11-13 MED ORDER — MUPIROCIN 2 % EX OINT: 1.0000 "application " | TOPICAL_OINTMENT | Freq: Two times a day (BID) | CUTANEOUS | 2 refills | Status: DC

## 2020-11-13 NOTE — Patient Instructions (Signed)

## 2020-11-15 NOTE — Progress Notes (Signed)
Subjective:   Patient ID: Leonarda Salon, female   DOB: 30 y.o.   MRN: [id number]   HPI 30 year old female presents the office today for concerns of ingrown toenail right big toe, lateral aspect.  The area is tender to touch and she is noticing swelling or redness.  Started about 1 week ago.  She went to urgent care and she was prescribed antibiotics which she recently just completed.  No red streaks.  No other concerns.  ROS: All other systems negative  Past Medical History:  Diagnosis Date   Anxiety    BV (bacterial vaginosis) 03/16/2015   GERD (gastroesophageal reflux disease)    History of abnormal cervical Pap smear 02/28/2015   History of chlamydia 04/17/2015   History of trichomoniasis 04/17/2015   Ovarian cyst    TOA (tubo-ovarian abscess) 10/09/2019   Wears glasses    Yeast infection 06/09/2014    Past Surgical History:  Procedure Laterality Date   FOOT SURGERY Left 2011   LAPAROSCOPIC BILATERAL SALPINGECTOMY Bilateral 03/08/2020   Procedure: LAPAROSCOPIC BILATERAL SALPINGECTOMY, DRAINAGE OF LEFT OVARIAN CYST;  Surgeon: [place] Bing, MD;  Location: [facility];  Service: Gynecology;  Laterality: Bilateral;     Current Outpatient Medications:    mupirocin ointment (BACTROBAN) 2 %, Apply 1 application topically 2 (two) times daily., Disp: 30 g, Rfl: 2   Acetaminophen-Caff-Pyrilamine (MIDOL COMPLETE PO), Take by mouth as needed., Disp: , Rfl:    hydroxypropyl methylcellulose / hypromellose (ISOPTO TEARS / GONIOVISC) 2.5 % ophthalmic solution, Place 1 drop into both eyes daily as needed for dry eyes., Disp: , Rfl:    naproxen (NAPROSYN) 500 MG tablet, Take 1 tablet (500 mg total) by mouth 2 (two) times daily with a meal., Disp: 30 tablet, Rfl: 0   norethindrone (AYGESTIN) 5 MG tablet, Take 1 tablet (5 mg total) by mouth daily. For stage 4 endometriosis suppression, Disp: 60 tablet, Rfl: 6   omeprazole (PRILOSEC) 20 MG capsule, Take 20 mg by mouth 2 (two) times daily  as needed., Disp: , Rfl:   No Known Allergies        Objective:  Physical Exam  General: AAO x3, NAD  Dermatological: Incurvation present to lateral aspect right hallux toenail with localized edema and erythema.  There is no drainage or pus or ascending cellulitis.  Tenderness palpation on the lateral nail border.  No other areas of discomfort.  No open lesions.  Vascular: Dorsalis Pedis artery and Posterior Tibial artery pedal pulses are 2/4 bilateral with immedate capillary fill time.There is no pain with calf compression, swelling, warmth, erythema.   Neruologic: Grossly intact via light touch bilateral.  Musculoskeletal: No gross boney pedal deformities bilateral. No pain, crepitus, or limitation noted with foot and ankle range of motion bilateral. Muscular strength 5/5 in all groups tested bilateral.  Gait: Unassisted, Nonantalgic.       Assessment:   Ingrown toenail right lateral hallux     Plan:  -Treatment options discussed including all alternatives, risks, and complications -Etiology of symptoms were discussed -At this time, the patient is requesting partial nail removal with chemical matricectomy to the symptomatic portion of the nail. Risks and complications were discussed with the patient for which they understand and written consent was obtained. Under sterile conditions a total of 3 mL of a mixture of 2% lidocaine plain and 0.5% Marcaine plain was infiltrated in a hallux block fashion. Once anesthetized, the skin was prepped in sterile fashion. A tourniquet was then applied. Next the lateral  aspect of hallux nail border was then sharply excised making sure to remove the entire offending nail border. Once the nails were ensured to be removed area was debrided and the underlying skin was intact. There is no purulence identified in the procedure. Next phenol was then applied under standard conditions and copiously irrigated. Silvadene was applied. A dry sterile dressing  was applied. After application of the dressing the tourniquet was removed and there is found to be an immediate capillary refill time to the digit. The patient tolerated the procedure well any complications. Post procedure instructions were discussed the patient for which he verbally understood. Follow-up in one week for nail check or sooner if any problems are to arise. Discussed signs/symptoms of infection and directed to call the office immediately should any occur or go directly to the emergency room. In the meantime, encouraged to call the office with any questions, concerns, changes symptoms.  Vivi Barrack DPM

## 2020-12-04 ENCOUNTER — Ambulatory Visit: Payer: 59 | Admitting: Podiatry

## 2021-03-21 IMAGING — CT CT ABD-PELV W/ CM
2 of 4 series · 16 of 46 positions shown, 18 images · IV contrast (Omnipaque or Isovue)
Comparison: None.

CLINICAL DATA: 29-year-old female with acute RIGHT abdominal and
pelvic pain.

EXAM:
CT ABDOMEN AND PELVIS WITH CONTRAST
TECHNIQUE: Multidetector CT imaging of the abdomen and pelvis was performed
using the standard protocol following bolus administration of
intravenous contrast.
CONTRAST:  100mL OMNIPAQUE IOHEXOL 300 MG/ML  SOLN

[Series 2: axial st · axial · 0.98mm/px · z∈[+733,+1183]mm · 13 of 100 slices shown, 15 images]
[im 5/100  soft-tissue]
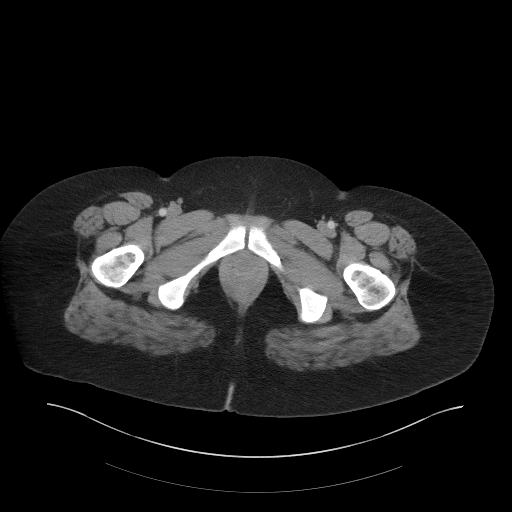
[im 5/100  bone]
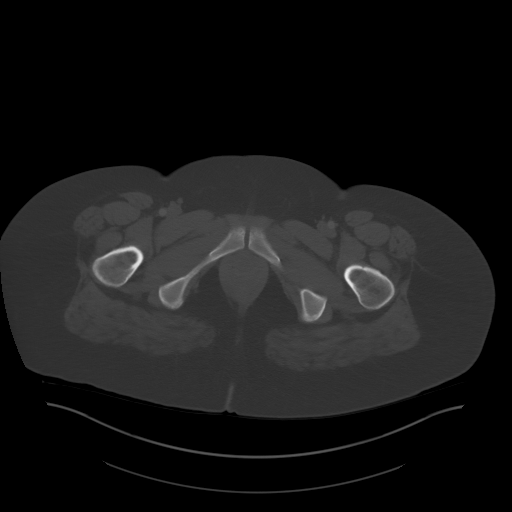
[im 13/100  soft-tissue]
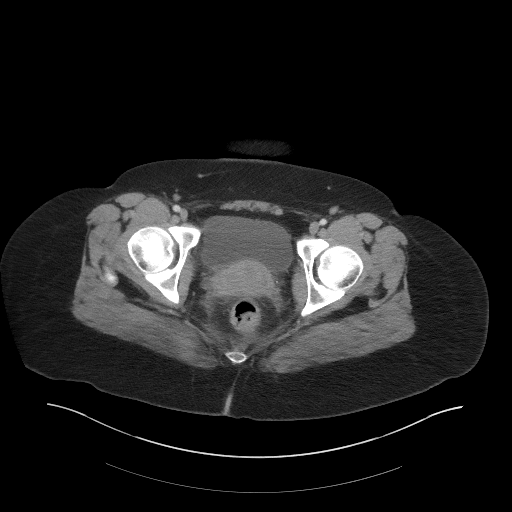
[im 22/100  soft-tissue]
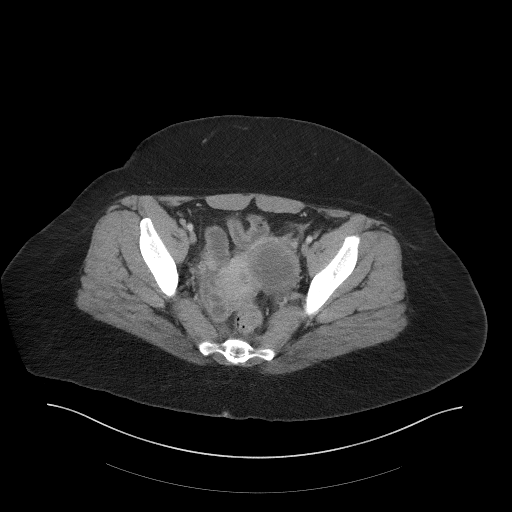
[im 26/100  soft-tissue]
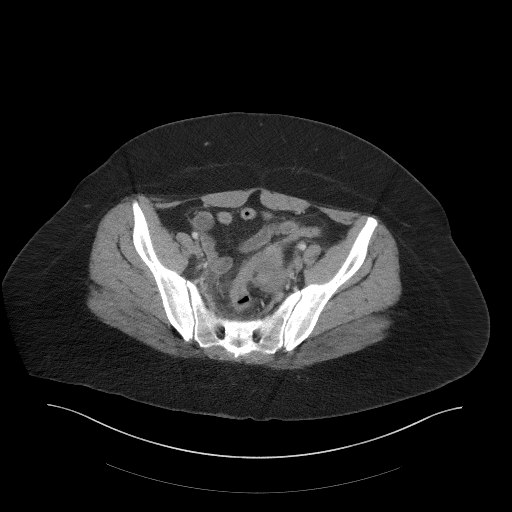
[im 35/100  soft-tissue]
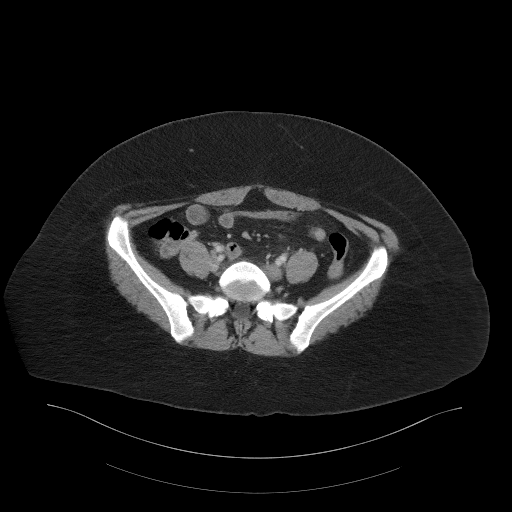
[im 44/100  soft-tissue]
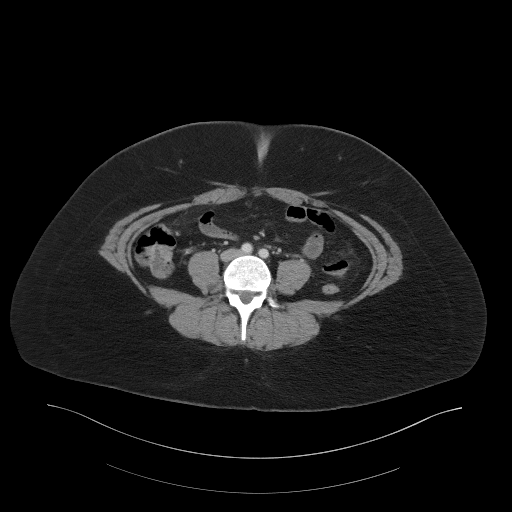
[im 52/100  soft-tissue]
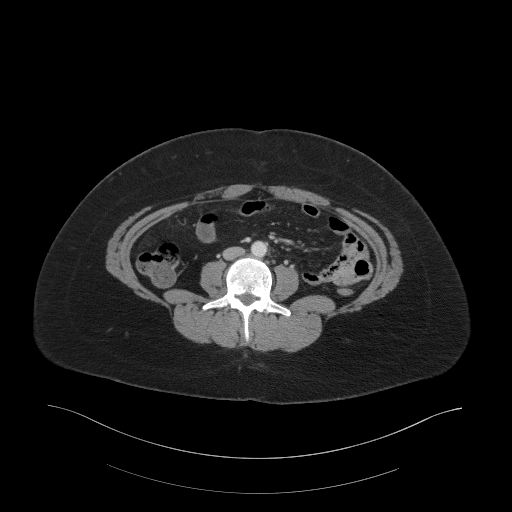
[im 56/100  soft-tissue]
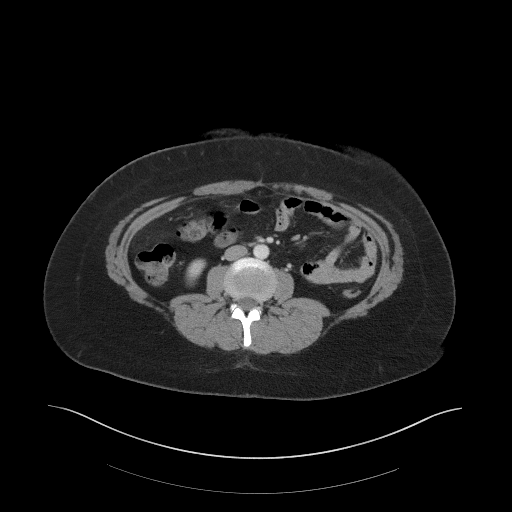
[im 65/100  soft-tissue]
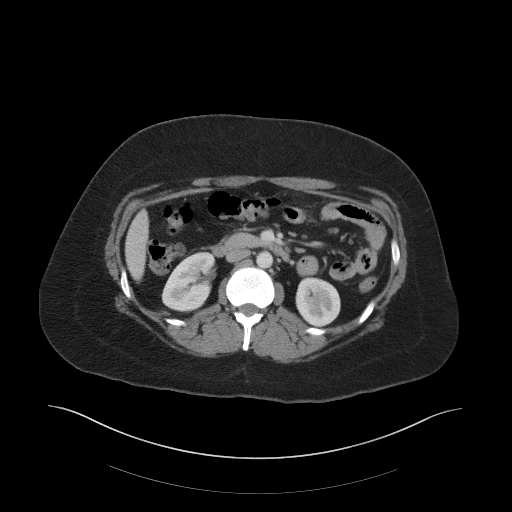
[im 65/100  bone]
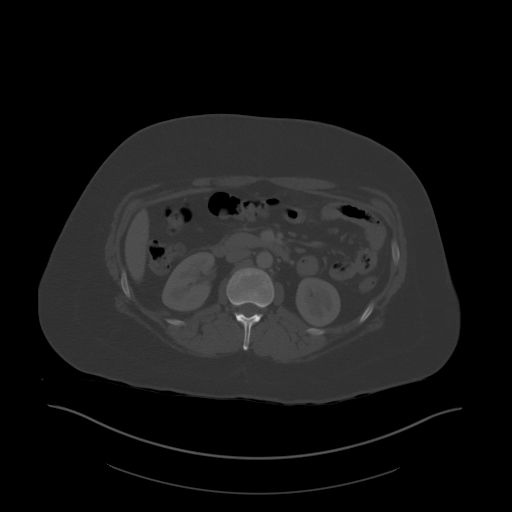
[im 74/100  soft-tissue]
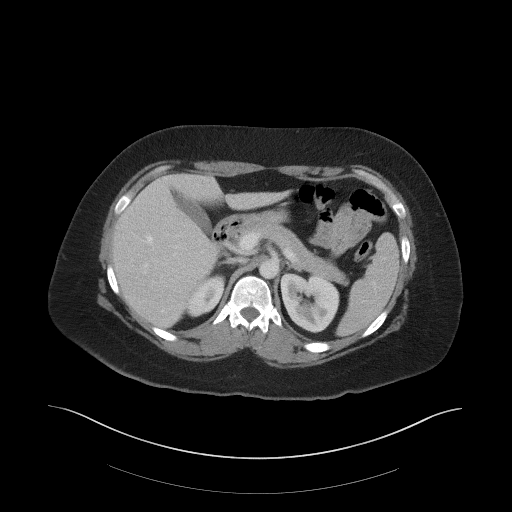
[im 78/100  soft-tissue]
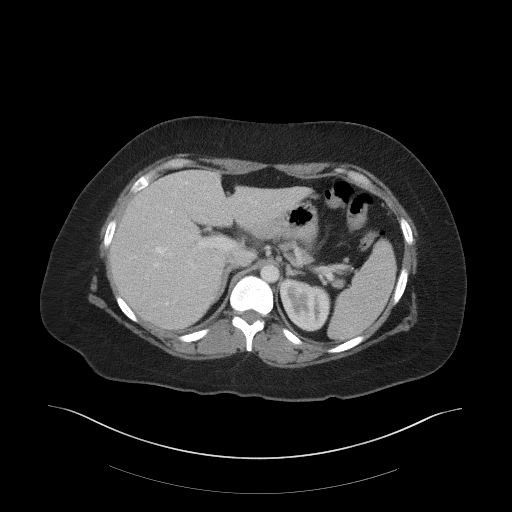
[im 87/100  soft-tissue]
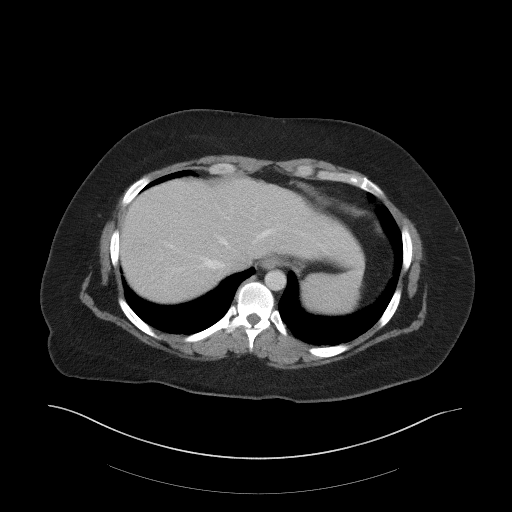
[im 95/100  soft-tissue]
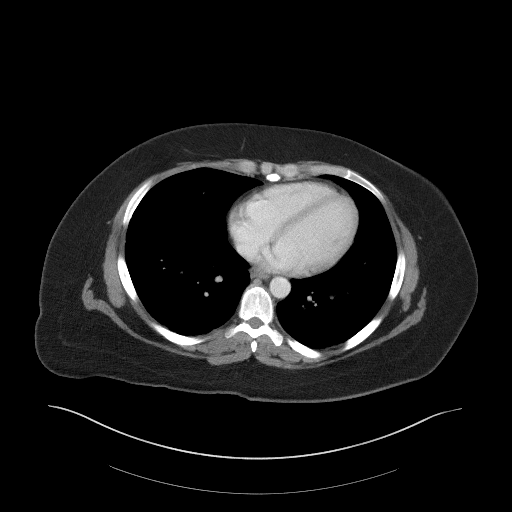

[Series 5: coronal st · coronal · 0.87mm/px · 3 of 86 slices shown]
[im 29/86  soft-tissue]
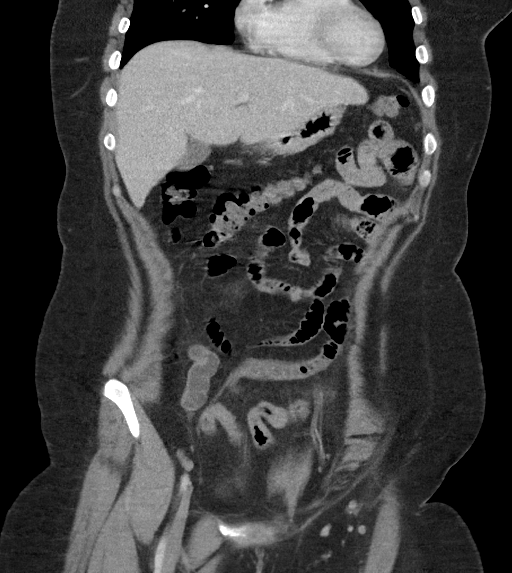
[im 38/86  soft-tissue]
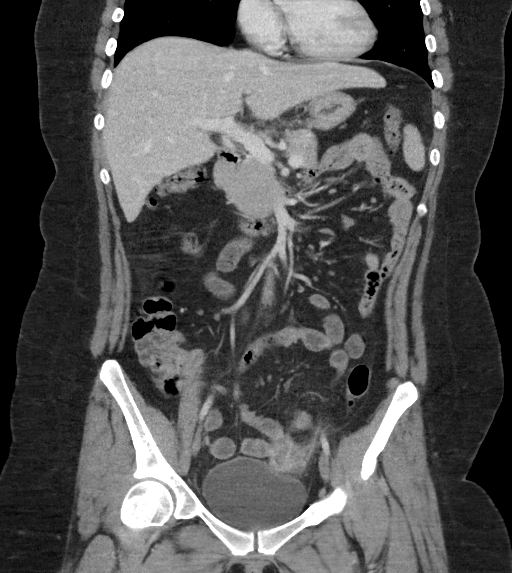
[im 48/86  soft-tissue]
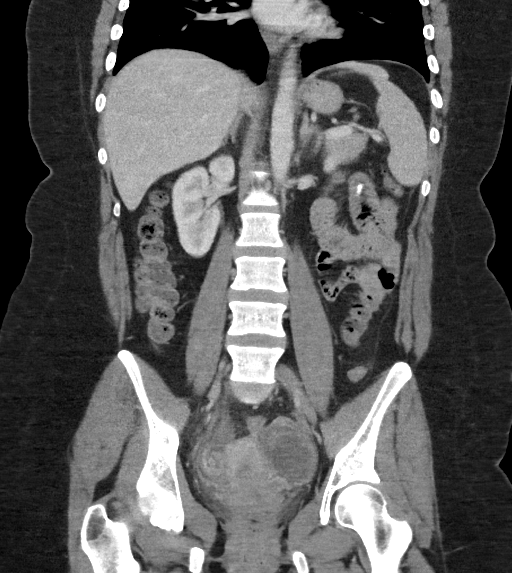

[16 of 46 positions shown; findings below may reference images not displayed]

FINDINGS: Lower chest: No acute abnormality. A calcified granuloma in the LEFT
LOWER lobe is present.

Hepatobiliary: The liver and gallbladder are unremarkable. No
biliary dilatation.

Pancreas: Unremarkable

Spleen: Unremarkable

Adrenals/Urinary Tract: The kidneys, adrenal glands and bladder are
unremarkable.

Stomach/Bowel: Stomach is within normal limits. Appendix appears
normal. No evidence of bowel wall thickening, distention, or
inflammatory changes.

Vascular/Lymphatic: No significant vascular findings are present. No
enlarged abdominal or pelvic lymph nodes.

Reproductive: A 5.5 cm thick-walled cystic structure in the LEFT
adnexal region is noted. A tubular structure in the RIGHT adnexal
region is noted. Stranding/inflammation within the pelvis is
present. The uterus is grossly unremarkable.

Other: A trace amount of free fluid in the pelvis is noted. No
evidence of pneumoperitoneum.

Musculoskeletal: No acute or suspicious bony abnormalities noted.
IMPRESSION: 1. 5.5 cm thick-walled cystic structure in the LEFT adnexal region
and tubular structure within the RIGHT adnexal region with adjacent
stranding/inflammation within the pelvis. The LEFT adnexal
abnormality is nonspecific and may represent ovarian torsion,
complex or complicated cyst or abscess. The RIGHT adnexal
abnormality probably represents a dilated fallopian tube with
possible infection/abscess. Further evaluation with pelvic
ultrasound is recommended.
2. No other significant abnormalities.

## 2021-03-21 IMAGING — US US PELVIS COMPLETE TRANSABD/TRANSVAG W DUPLEX
2 series · 13 of 25 positions shown · non-contrast
Comparison: CT abdomen pelvis October 09, 2019

CLINICAL DATA: Evaluate for possible ovarian torsion.

EXAM:
TRANSABDOMINAL AND TRANSVAGINAL ULTRASOUND OF PELVIS
DOPPLER ULTRASOUND OF OVARIES
TECHNIQUE: Both transabdominal and transvaginal ultrasound examinations of the
pelvis were performed. Transabdominal technique was performed for
global imaging of the pelvis including uterus, ovaries, adnexal
regions, and pelvic cul-de-sac.
It was necessary to proceed with endovaginal exam following the
transabdominal exam to visualize the adnexal structures. Color and
duplex Doppler ultrasound was utilized to evaluate blood flow to the
ovaries.

[Series 1: us pelvic complete w transvaginal and torsion righ · 137 acquisitions, 7 frames shown (1 of 2)]
[im 1/137]
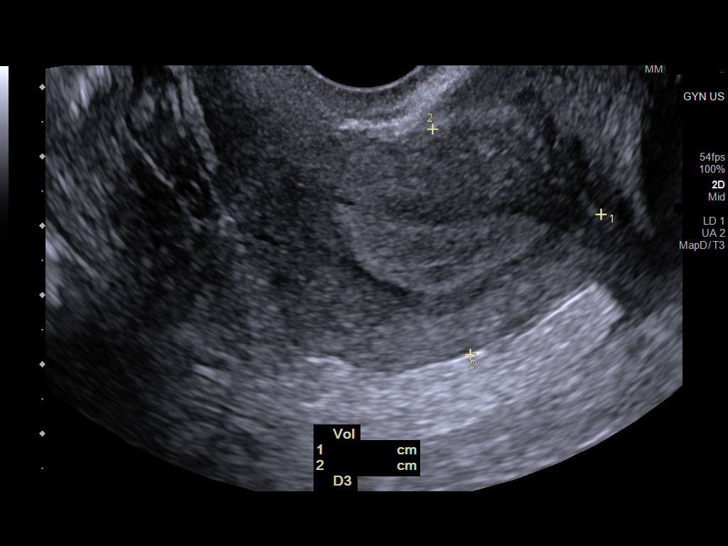
[im 23/137]
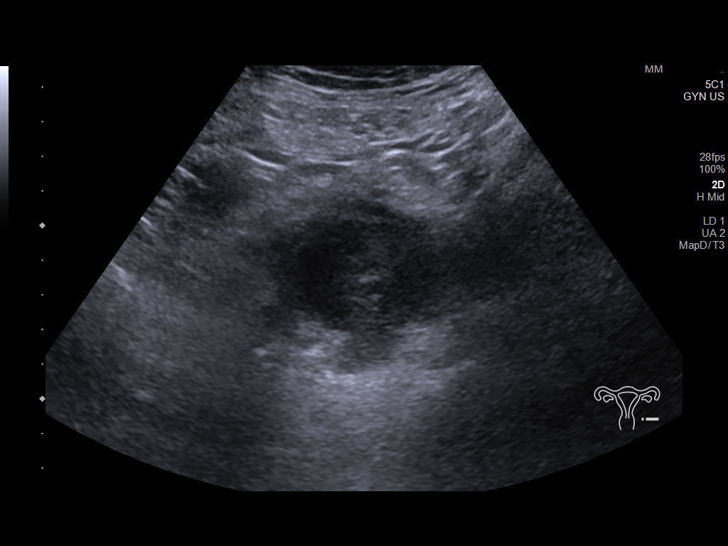
[im 46/137]
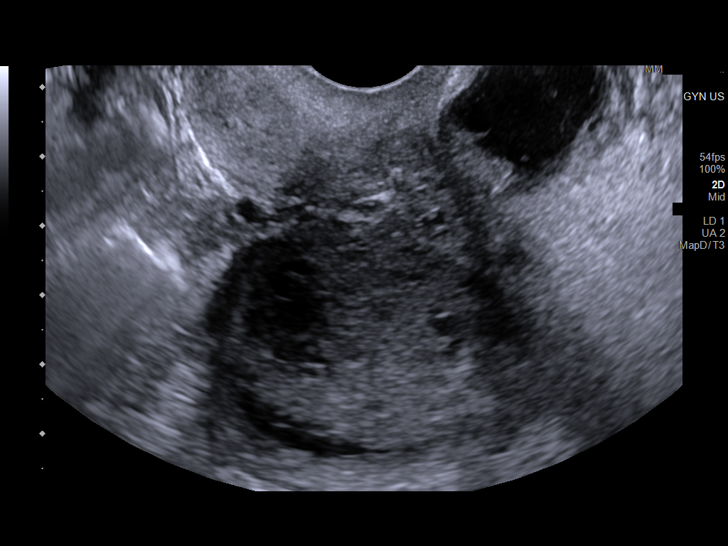
[im 69/137]
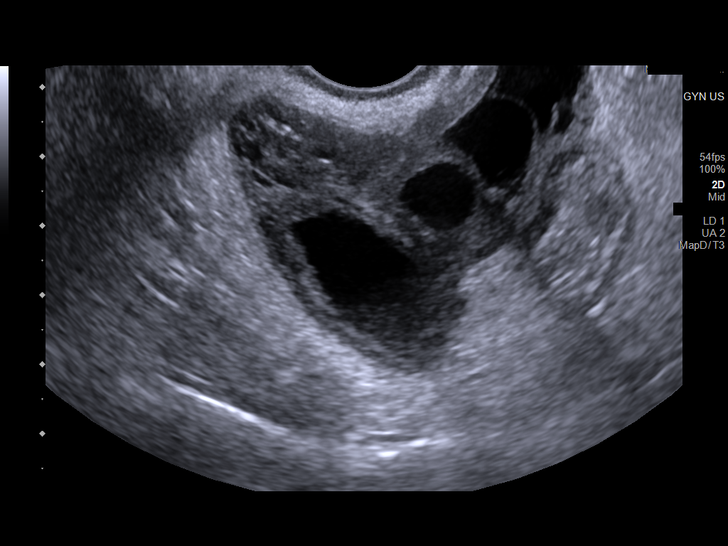
[im 91/137]
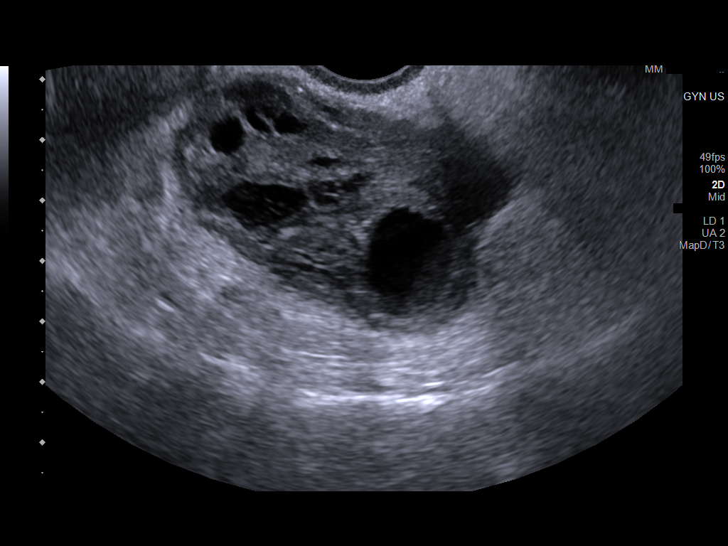
[im 114/137]
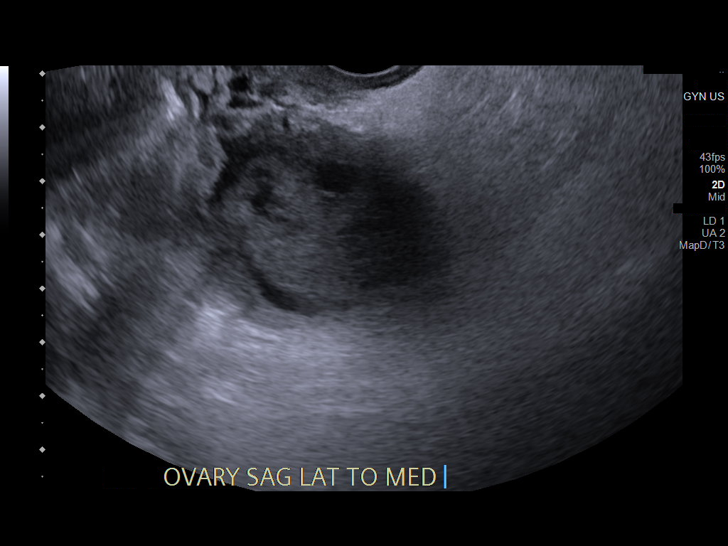
[im 137/137]
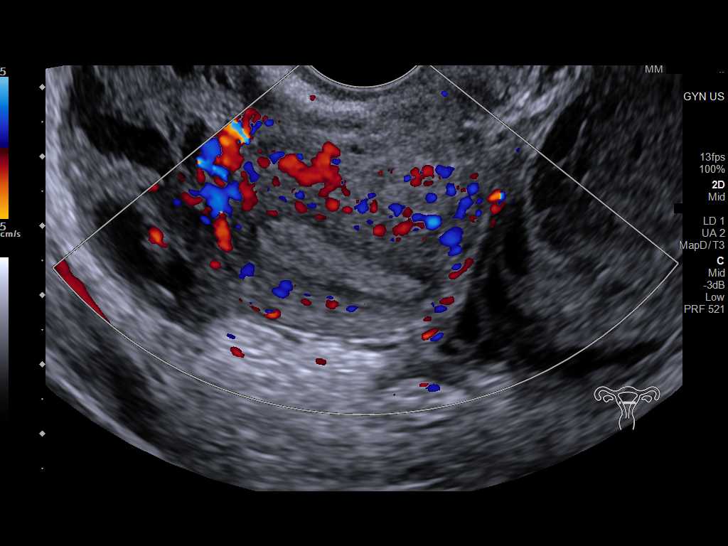

[Series 1: us pelvic complete w transvaginal and torsion righ · 6 of 135 slices shown (2 of 2)]
[im 13/135]
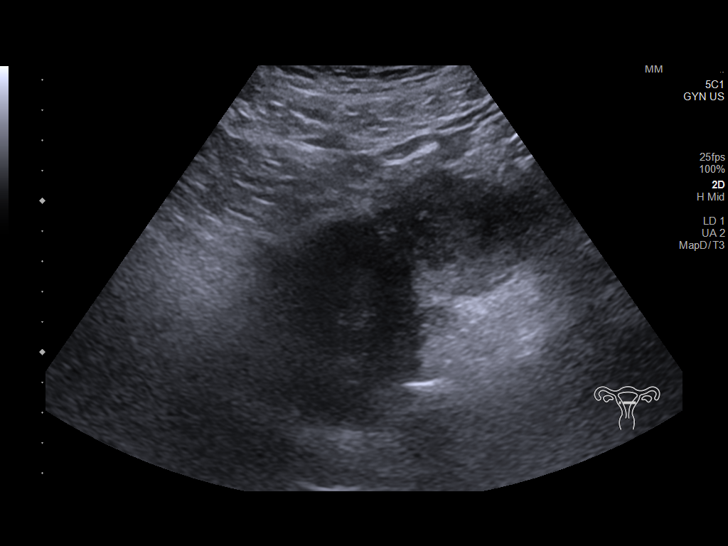
[im 37/135]
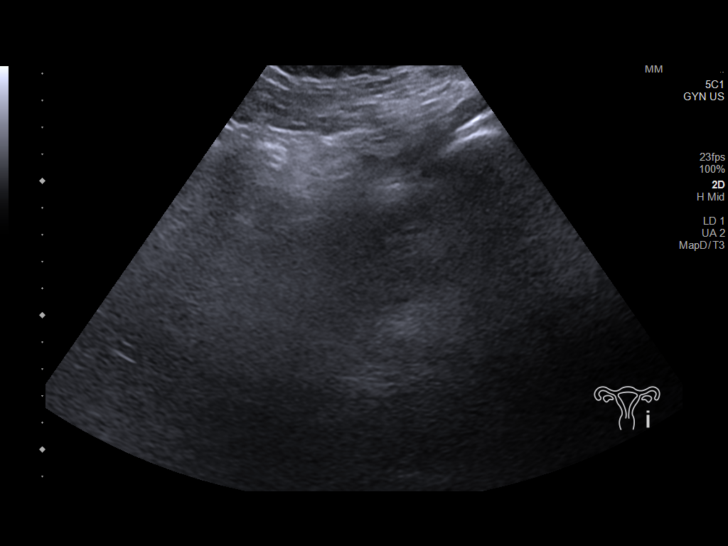
[im 61/135]
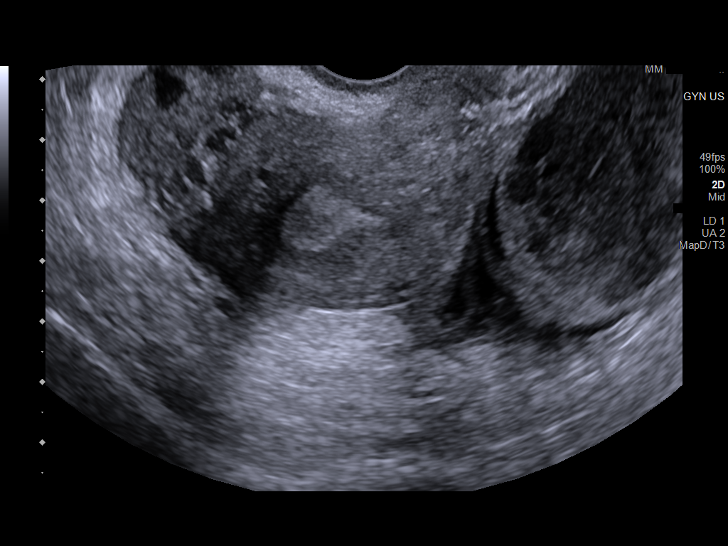
[im 86/135]
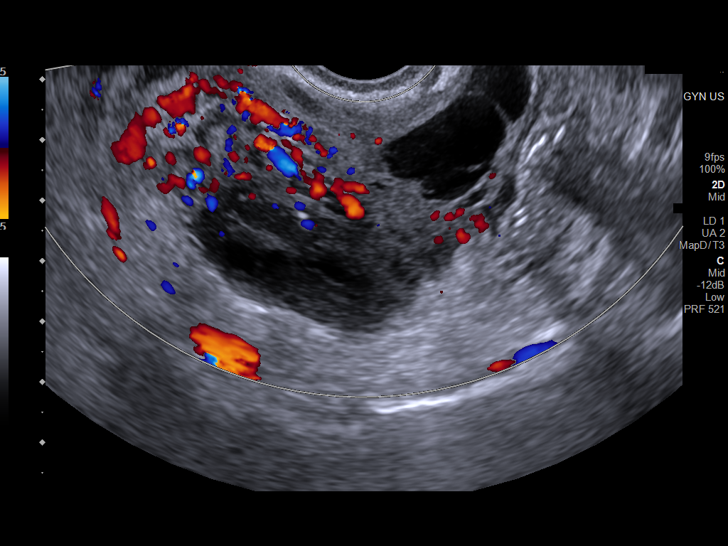
[im 110/135]
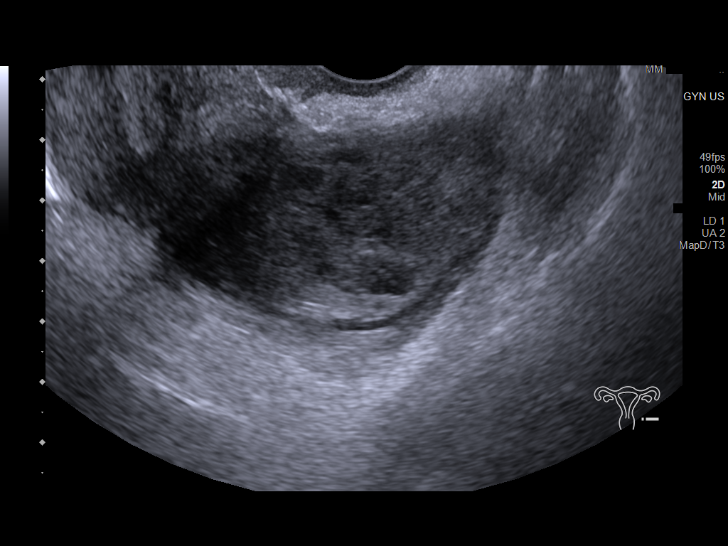
[im 135/135]
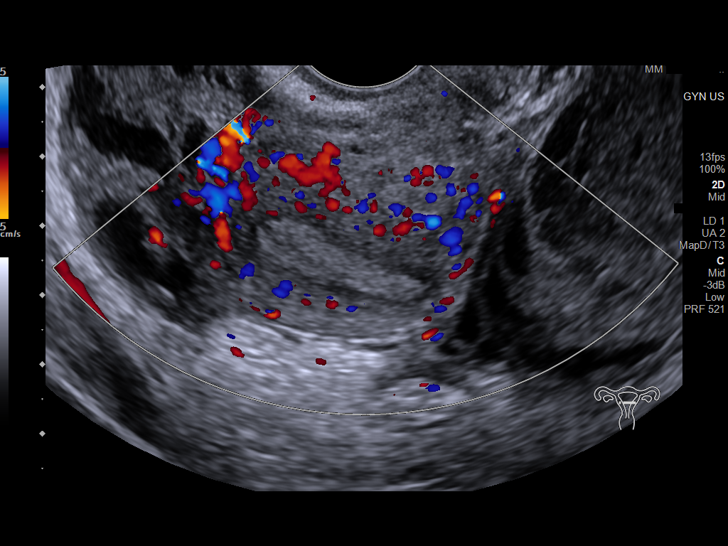

[13 of 25 positions shown; findings below may reference images not displayed]

FINDINGS: Uterus

Measurements: 6.0 x 3.3 x 3.8 cm = volume: 39 mL. No fibroids or
other mass visualized.

Endometrium

Thickness: 10 mm.  No focal abnormality visualized.

Right ovary

Measurements: 4.6 x 2.9 x 3.3 cm = volume: 23 mL. Within the right
adnexa adjacent to the right ovary there is a complex tubular
structure most compatible with flow can 2 and complex fluid.

Left ovary

Measurements: 6.0 x 4.0 x 5.3 cm = volume: 65 mL. Heterogeneous in
echogenicity with multiple internal cystic structures. Small amount
of surrounding fluid.

Pulsed Doppler evaluation of the right ovary demonstrates normal
low-resistance arterial and venous waveforms. Only peripheral flow
is demonstrated about the left ovary.

Other findings

Small amount of free fluid in the pelvis.
IMPRESSION: 1. The left ovary is enlarged and heterogeneous in echogenicity
without internal color flow. Given the overall findings within the
pelvis and clinical presentation, this is favored to represent left
adnexal tubo-ovarian abscess. Additional considerations for the
appearance of the left ovary would be torsion of the left ovary or
potentially left ovarian hemorrhagic cyst.
2. Findings most compatible with right adnexal pyosalpinx.
3. These results were called by telephone at the time of
interpretation on 10/09/2019 at [DATE] to provider Bettis, who
verbally acknowledged these results.

## 2021-04-24 ENCOUNTER — Other Ambulatory Visit: Payer: Self-pay | Admitting: Obstetrics and Gynecology

## 2021-04-26 ENCOUNTER — Other Ambulatory Visit: Payer: Self-pay

## 2021-04-26 ENCOUNTER — Encounter: Payer: Self-pay | Admitting: Obstetrics and Gynecology

## 2021-04-26 NOTE — Telephone Encounter (Signed)
Pt is requesting a 90 refill on Aygestin.  Message routed to Dr. Vergie Living.   ? ?Anija Brickner,RN  ?04/26/21 ?

## 2021-04-27 MED ORDER — NORETHINDRONE ACETATE 5 MG PO TABS: 5.0000 mg | ORAL_TABLET | Freq: Every day | ORAL | 6 refills | Status: DC

## 2021-05-12 IMAGING — US US PELVIS COMPLETE WITH TRANSVAGINAL
1 series · 15 of 25 positions shown · non-contrast
Comparison: 10/09/2019

CLINICAL DATA: Follow-up tubo-ovarian abscess.

EXAM:
TRANSABDOMINAL AND TRANSVAGINAL ULTRASOUND OF PELVIS
TECHNIQUE: Both transabdominal and transvaginal ultrasound examinations of the
pelvis were performed. Transabdominal technique was performed for
global imaging of the pelvis including uterus, ovaries, adnexal
regions, and pelvic cul-de-sac. It was necessary to proceed with
endovaginal exam following the transabdominal exam to visualize the
ovaries and adnexal masses.

[Series 1: us pelvis complete with transvaginal · 113 acquisitions, 15 frames shown]
[im 1/113]
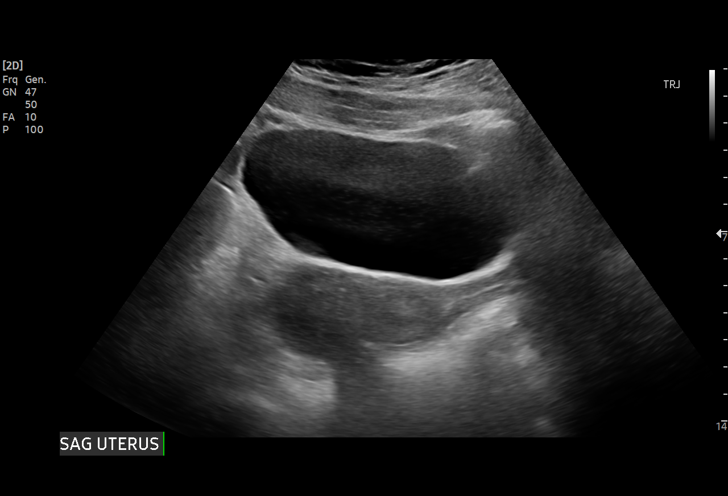
[im 10/113]
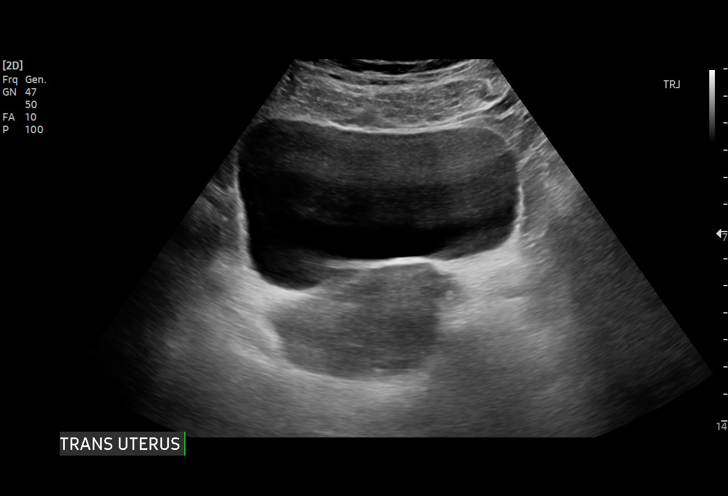
[im 19/113]
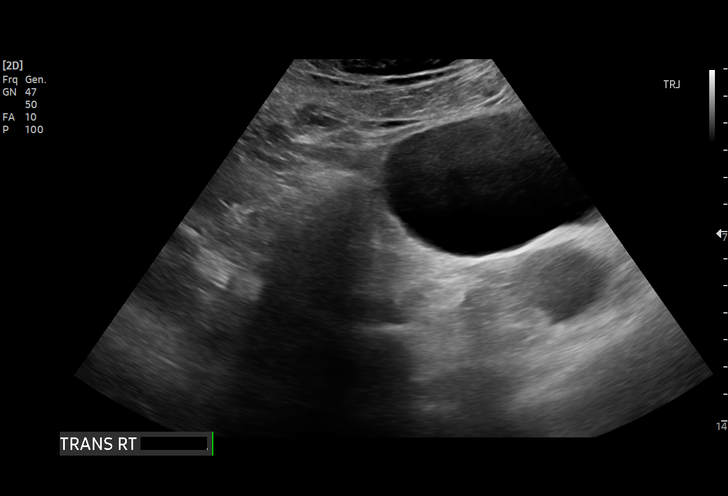
[im 24/113]
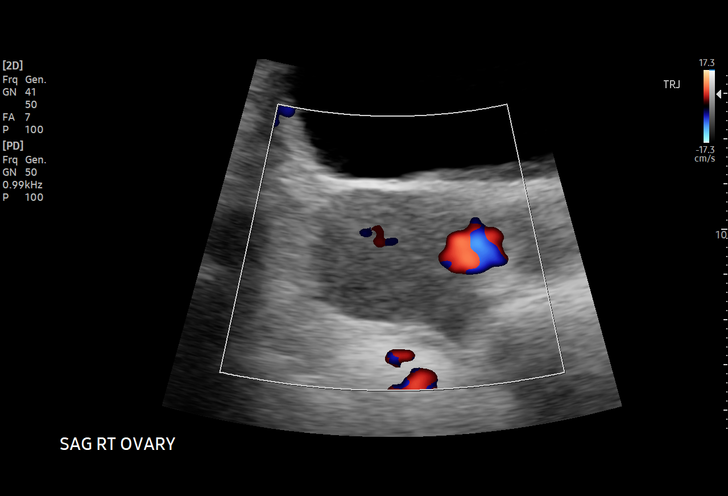
[im 33/113]
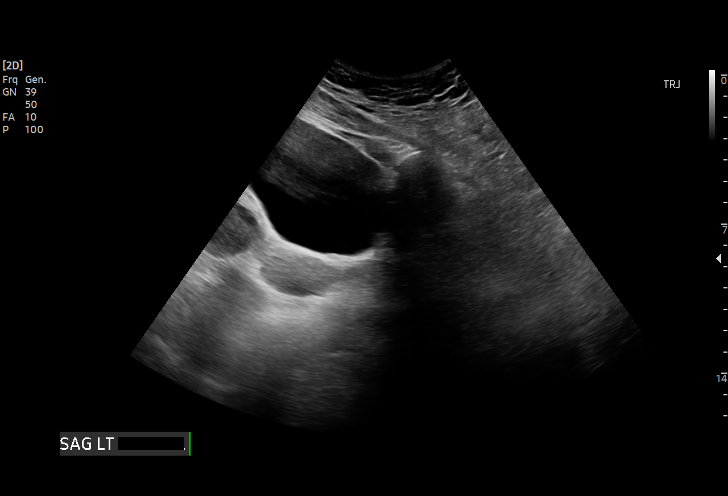
[im 43/113]
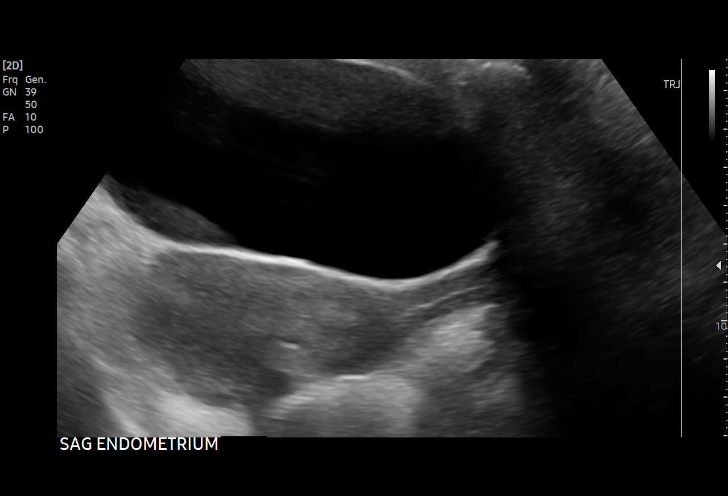
[im 47/113]
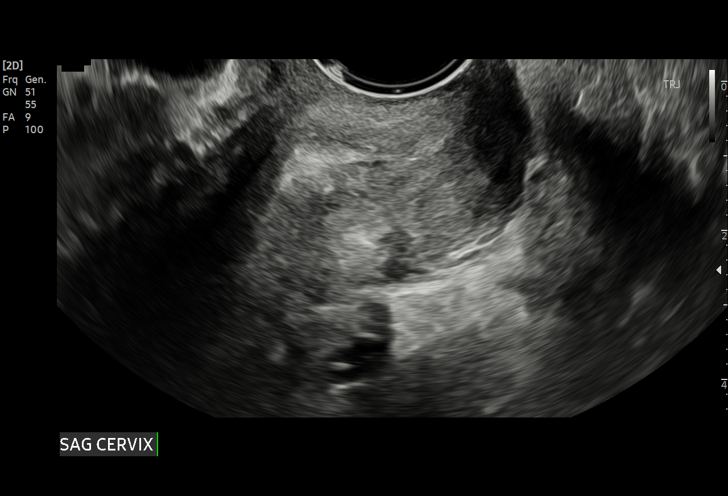
[im 57/113]
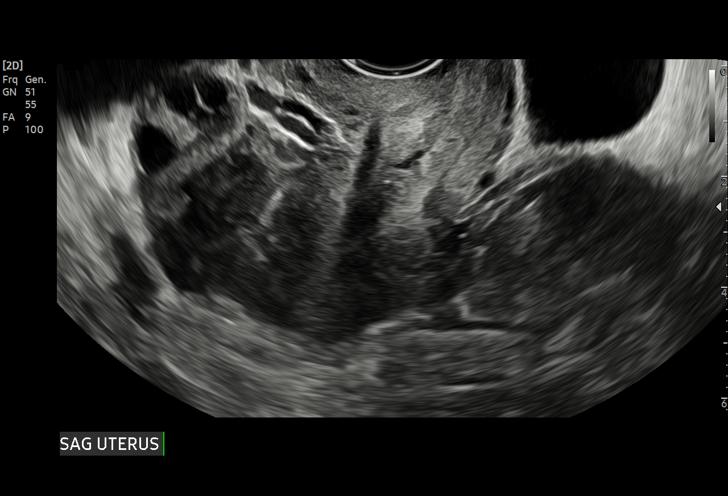
[im 66/113]
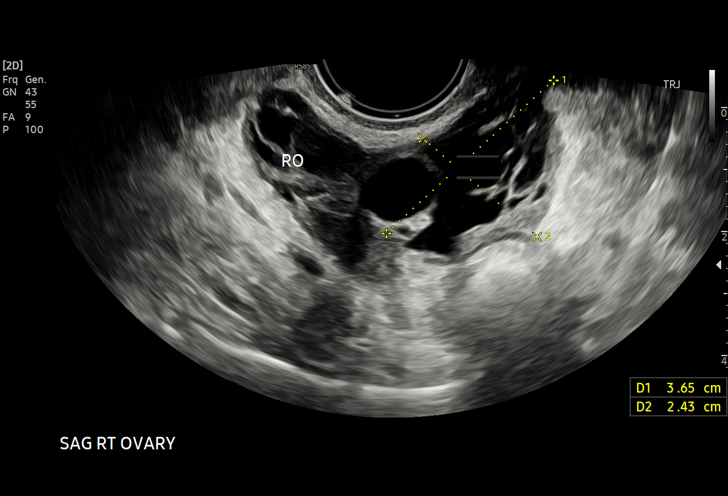
[im 71/113]
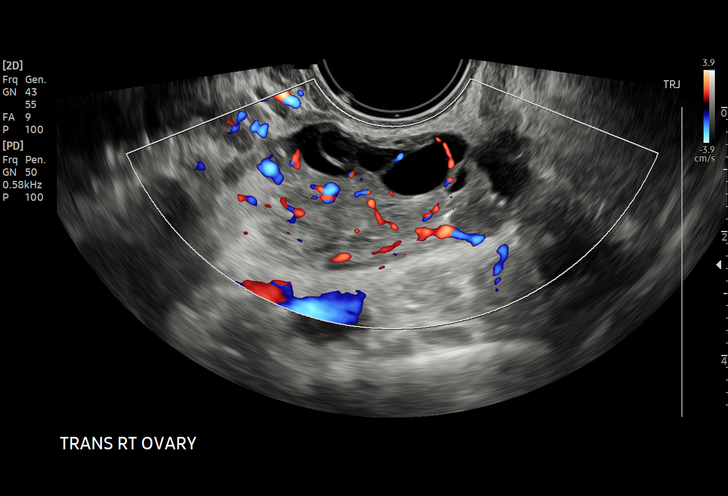
[im 80/113]
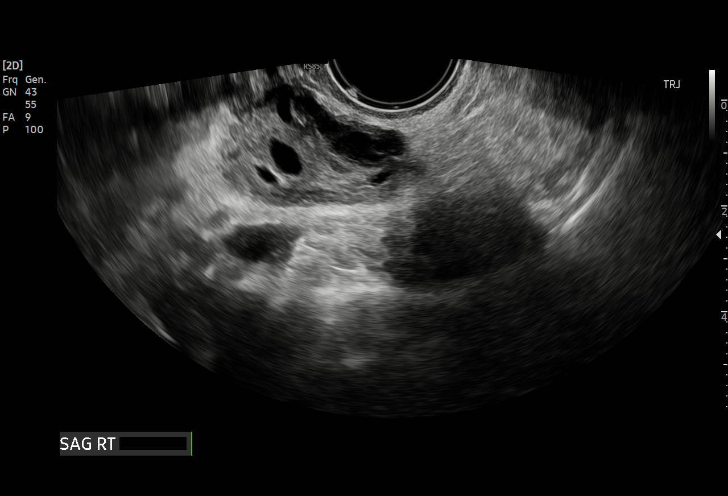
[im 89/113]
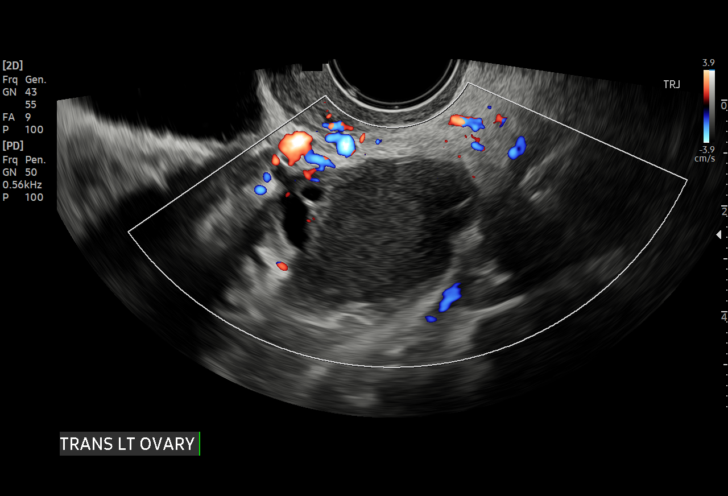
[im 94/113]
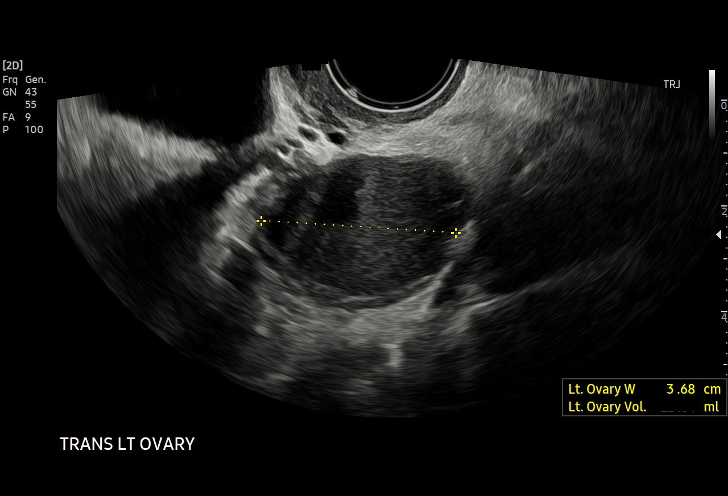
[im 103/113]
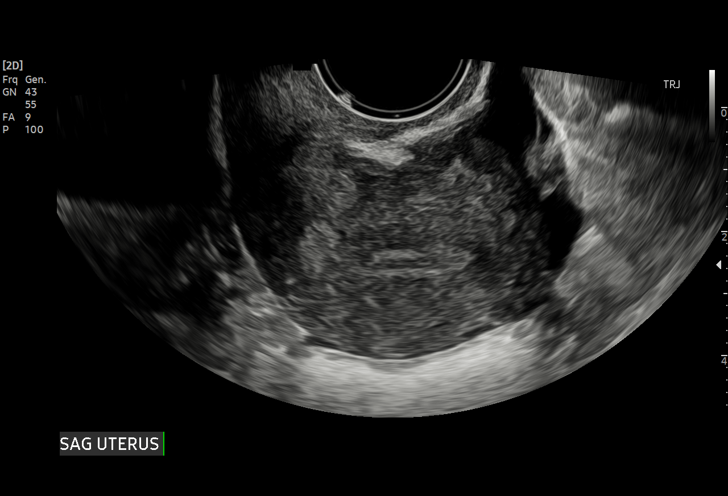
[im 113/113]
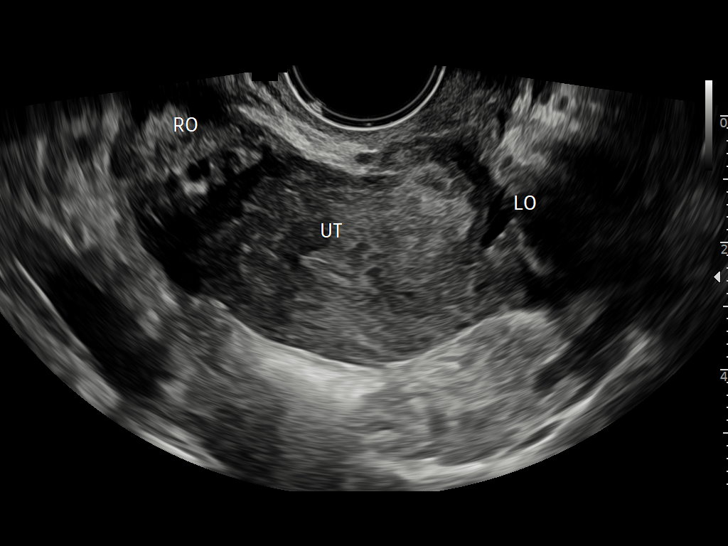

[15 of 25 positions shown; findings below may reference images not displayed]

FINDINGS: Uterus

Measurements: 6.3 x 2.2 x 6.2 cm = volume: 45 mL. Retroverted. No
fibroids or other mass visualized.

Endometrium

Thickness: 4 mm.  No focal abnormality visualized.

Right ovary

Measurements: 3.8 x 1.9 x 3.4 cm = volume: 12.8 mL. Right ovary is
normal in appearance. A mild complex tubular structure is again seen
in the right adnexa adjacent to the ovary, which shows no
significant change compared to previous study. This is consistent
with Laaouina Tiger.

Left ovary

Measurements: A complex cystic lesion with uniform low-level
internal echoes is seen in the right adnexa which abuts the right
ovary. This measures 4.6 x 2.6 x 2.7 cm, mildly decreased in size
from 6.0 x 4.0 x 5.3 cm previously. This is consistent with partial
resolution of tubo-ovarian abscess, with differential diagnosis also
including endometrioma.

Other findings

Tiny amount of simple free fluid noted.
IMPRESSION: Mild decrease in size of 4.6 cm complex cystic lesion in right
adnexa. This would be consistent with partial resolution of
tubo-ovarian abscess, although differential diagnosis also includes
endometrioma.

Stable mild right Ruiting.

## 2022-03-11 ENCOUNTER — Emergency Department (HOSPITAL_COMMUNITY)
Admission: EM | Admit: 2022-03-11 | Discharge: 2022-03-11 | Disposition: A | Discharge status: Home / Self Care | Payer: 59 | Attending: Emergency Medicine | Admitting: Emergency Medicine

## 2022-03-11 ENCOUNTER — Other Ambulatory Visit: Payer: Self-pay

## 2022-03-11 DIAGNOSIS — T40715A Adverse effect of cannabis, initial encounter: Secondary | ICD-10-CM | POA: Diagnosis not present

## 2022-03-11 DIAGNOSIS — R110 Nausea: Secondary | ICD-10-CM | POA: Diagnosis not present

## 2022-03-11 DIAGNOSIS — R42 Dizziness and giddiness: Secondary | ICD-10-CM | POA: Diagnosis present

## 2022-03-11 DIAGNOSIS — T887XXA Unspecified adverse effect of drug or medicament, initial encounter: Secondary | ICD-10-CM

## 2022-03-11 MED ORDER — LORAZEPAM 1 MG PO TABS
1.0000 mg | ORAL_TABLET | Freq: Once | ORAL | Status: AC
  Administered 2022-03-11: 1 mg via ORAL
  Filled 2022-03-11: qty 1

## 2022-03-11 NOTE — ED Provider Notes (Signed)
[facility] Emergency Department Provider Note MRN:  [id number]  Arrival date & time: 03/11/22     Chief Complaint   Dizziness   History of Present Illness   Mackenzie Harris is a 32 y.o. year-old female with no pertinent past medical history presenting to the ED with chief complaint of dizziness.  Patient ingested a THC gummy at about 8 or 9 PM.  She woke up at 1 AM feeling dizzy and mildly nauseated.  She woke up again at 4 AM still feeling quite unwell.  Dizzy and nauseated with any movement or trying to stand up.  Also endorsing a tingly paresthesia feeling to the entire body.  Heart rate a bit elevated.  Review of Systems  A thorough review of systems was obtained and all systems are negative except as noted in the HPI and PMH.   Patient's Health History    Past Medical History:  Diagnosis Date   Anxiety    BV (bacterial vaginosis) 03/16/2015   GERD (gastroesophageal reflux disease)    History of abnormal cervical Pap smear 02/28/2015   History of chlamydia 04/17/2015   History of trichomoniasis 04/17/2015   Ovarian cyst    TOA (tubo-ovarian abscess) 10/09/2019   Wears glasses    Yeast infection 06/09/2014    Past Surgical History:  Procedure Laterality Date   FOOT SURGERY Left 2011   LAPAROSCOPIC BILATERAL SALPINGECTOMY Bilateral 03/08/2020   Procedure: LAPAROSCOPIC BILATERAL SALPINGECTOMY, DRAINAGE OF LEFT OVARIAN CYST;  Surgeon: Aletha Halim, MD;  Location: [facility];  Service: Gynecology;  Laterality: Bilateral;    Family History  Problem Relation Age of Onset   Diabetes Mother        borderline   Hypertension Father    Diabetes Maternal Grandmother    Hypertension Maternal Grandmother    Diabetes Maternal Grandfather    Hypertension Maternal Grandfather    Alzheimer's disease Paternal Grandfather     Social History   Socioeconomic History   Marital status: Single    Spouse name: Not on file   Number of children: Not on  file   Years of education: Not on file   Highest education level: Not on file  Occupational History   Not on file  Tobacco Use   Smoking status: Never   Smokeless tobacco: Never  Vaping Use   Vaping Use: Never used  Substance and Sexual Activity   Alcohol use: Not Currently   Drug use: No   Sexual activity: Yes    Birth control/protection: None  Other Topics Concern   Not on file  Social History Narrative   Not on file   Social Determinants of Health   Financial Resource Strain: Not on file  Food Insecurity: No Food Insecurity (09/21/2020)   Hunger Vital Sign    Worried About Running Out of Food in the Last Year: Never true    Ran Out of Food in the Last Year: Never true  Transportation Needs: No Transportation Needs (09/21/2020)   PRAPARE - Hydrologist (Medical): No    Lack of Transportation (Non-Medical): No  Physical Activity: Not on file  Stress: Not on file  Social Connections: Not on file  Intimate Partner Violence: Not on file     Physical Exam   Vitals:   03/11/22 0447 03/11/22 0500  BP: (!) 126/58 (!) 124/59  Pulse: (!) 117 (!) 103  Resp: 19   Temp: 97.7 F (36.5 C)   SpO2: 98% 96%  CONSTITUTIONAL: Well-appearing, NAD NEURO/PSYCH:  Alert and oriented x 3, no focal deficits EYES:  eyes equal and reactive ENT/NECK:  no LAD, no JVD CARDIO: Regular rate, well-perfused, normal S1 and S2 PULM:  CTAB no wheezing or rhonchi GI/GU:  non-distended, non-tender MSK/SPINE:  No gross deformities, no edema SKIN:  no rash, atraumatic   *Additional and/or pertinent findings included in MDM below  Diagnostic and Interventional Summary    EKG Interpretation  Date/Time:  Monday March 11 2022 05:29:39 EST Ventricular Rate:  90 PR Interval:  130 QRS Duration: 77 QT Interval:  332 QTC Calculation: 407 R Axis:   83 Text Interpretation: Sinus rhythm Confirmed by Gerlene Fee [phone]) on 03/11/2022 5:31:54 AM       Labs Reviewed  - No data to display  No orders to display    Medications  LORazepam (ATIVAN) tablet 1 mg (1 mg Oral Given 03/11/22 0525)     Procedures  /  Critical Care Procedures  ED Course and Medical Decision Making  Initial Impression and Ddx History is highly suggestive of side effects of THC gummy.  Well-appearing on exam, still feeling very unwell.  Will treat symptoms, nausea.  Obtaining screening EKG given the dizziness.  Anticipating discharge.  Past medical/surgical history that increases complexity of ED encounter: None  Interpretation of Diagnostics I personally reviewed the EKG and my interpretation is as follows: Sinus rhythm, normal intervals    Patient Reassessment and Ultimate Disposition/Management     Patient is feeling better, appropriate for discharge.  Patient management required discussion with the following services or consulting groups:  None  Complexity of Problems Addressed Acute complicated illness or Injury  Additional Data Reviewed and Analyzed Further history obtained from: None  Additional Factors Impacting ED Encounter Risk None  Barth Kirks. Sedonia Small, Henrietta [email or web address]  Final Clinical Impressions(s) / ED Diagnoses     ICD-10-CM   1. Dizziness  R42     2. Drug side effects  [email or web address]       ED Discharge Orders     None        Discharge Instructions Discussed with and Provided to Patient:   Discharge Instructions   None      Maudie Flakes, MD 03/11/22 [phone]

## 2022-03-11 NOTE — ED Triage Notes (Signed)
Pt states she took a CBD gummy last night before going to bed, woke up feeling "tingly", nauseous and dizzy when standing.

## 2022-03-11 NOTE — Discharge Instructions (Signed)
You were evaluated in the Emergency Department and after careful evaluation, we did not find any emergent condition requiring admission or further testing in the hospital.  Your exam/testing today is overall reassuring.  Please return to the Emergency Department if you experience any worsening of your condition.   Thank you for allowing [place] to be a part of your care.

## 2022-03-12 ENCOUNTER — Other Ambulatory Visit: Payer: Self-pay | Admitting: Obstetrics and Gynecology

## 2022-05-27 ENCOUNTER — Telehealth: Payer: Self-pay | Admitting: Lactation Services

## 2022-05-27 NOTE — Telephone Encounter (Signed)
Received prescription refill request for Aygestin. Per Chart review, patient has not been seen in the office since 09/21/2020.   Called patient and was not able to reach her. LM for patient to check her My Chart message and to reach out to the office to schedule an appointment.   Will route to Dr. Vergie Living for advisement.

## 2022-05-31 ENCOUNTER — Other Ambulatory Visit: Payer: Self-pay | Admitting: Obstetrics and Gynecology

## 2022-05-31 MED ORDER — NORETHINDRONE ACETATE 5 MG PO TABS: 5.0000 mg | ORAL_TABLET | Freq: Every day | ORAL | 6 refills | Status: DC

## 2022-06-04 ENCOUNTER — Encounter: Payer: Self-pay | Admitting: Lactation Services

## 2022-06-13 ENCOUNTER — Encounter: Payer: Self-pay | Admitting: *Deleted

## 2022-07-11 ENCOUNTER — Ambulatory Visit: Payer: 59 | Admitting: Obstetrics and Gynecology

## 2022-07-25 ENCOUNTER — Emergency Department (HOSPITAL_COMMUNITY)
Admission: EM | Admit: 2022-07-25 | Discharge: 2022-07-25 | Disposition: A | Discharge status: Home / Self Care | Payer: 59 | Attending: Student | Admitting: Student

## 2022-07-25 ENCOUNTER — Other Ambulatory Visit: Payer: Self-pay

## 2022-07-25 ENCOUNTER — Emergency Department (HOSPITAL_COMMUNITY): Payer: 59

## 2022-07-25 ENCOUNTER — Encounter (HOSPITAL_COMMUNITY): Payer: Self-pay | Admitting: *Deleted

## 2022-07-25 DIAGNOSIS — W19XXXA Unspecified fall, initial encounter: Secondary | ICD-10-CM | POA: Diagnosis not present

## 2022-07-25 DIAGNOSIS — S93401A Sprain of unspecified ligament of right ankle, initial encounter: Secondary | ICD-10-CM

## 2022-07-25 DIAGNOSIS — M25571 Pain in right ankle and joints of right foot: Secondary | ICD-10-CM | POA: Diagnosis present

## 2022-07-25 MED ORDER — KETOROLAC TROMETHAMINE 15 MG/ML IJ SOLN
15.0000 mg | Freq: Once | INTRAMUSCULAR | Status: AC
  Administered 2022-07-25: 15 mg via INTRAMUSCULAR
  Filled 2022-07-25: qty 1

## 2022-07-25 MED ORDER — NAPROXEN 375 MG PO TABS: 375.0000 mg | ORAL_TABLET | Freq: Two times a day (BID) | ORAL | 0 refills | Status: AC

## 2022-07-25 NOTE — ED Triage Notes (Signed)
Pt states she was walking down the steps when her right ankle twisted causing her to fall down the steps;  Pt has pain and swelling to right ankle and abrasion with some bleeding to left knee

## 2022-07-25 NOTE — ED Provider Notes (Signed)
[place] EMERGENCY DEPARTMENT AT [facility] Provider Note  CSN: [id number] Arrival date & time: 07/25/22 4098  Chief Complaint(s) Fall  HPI Colena Ketterman is a 32 y.o. female who presents emergency room for evaluation of a fall with right ankle pain.  Patient states that she rolled her ankle and suffered a mechanical fall falling to the ground.  She arrives with an abrasion over the left knee but denies pain over the left knee.  Tetanus is up-to-date.  Difficulty bearing weight on the right ankle and right foot.  Denies chest pain, shortness of breath, Donnell pain, nausea, vomiting or other systemic or traumatic implants.   Past Medical History Past Medical History:  Diagnosis Date   Anxiety    BV (bacterial vaginosis) 03/16/2015   GERD (gastroesophageal reflux disease)    History of abnormal cervical Pap smear 02/28/2015   History of chlamydia 04/17/2015   History of trichomoniasis 04/17/2015   Ovarian cyst    TOA (tubo-ovarian abscess) 10/09/2019   Wears glasses    Yeast infection 06/09/2014   Patient Active Problem List   Diagnosis Date Noted   Dyspareunia in female 09/21/2020   Decreased libido 09/21/2020   Pelvic adhesive disease 03/08/2020   Endometriosis determined by laparoscopy 03/08/2020   Left endometrioma 03/08/2020   TOA (tubo-ovarian abscess) 10/09/2019   Home Medication(s) Prior to Admission medications   Medication Sig Start Date End Date Taking? Authorizing Provider  Acetaminophen-Caff-Pyrilamine (MIDOL COMPLETE PO) Take by mouth as needed.    [provider]  hydroxypropyl methylcellulose / hypromellose (ISOPTO TEARS / GONIOVISC) 2.5 % ophthalmic solution Place 1 drop into both eyes daily as needed for dry eyes.    [provider]  mupirocin ointment (BACTROBAN) 2 % Apply 1 application topically 2 (two) times daily. 11/13/20   Vivi Barrack, DPM  naproxen (NAPROSYN) 500 MG tablet Take 1 tablet (500 mg total) by mouth 2 (two) times  daily with a meal. 10/28/20   Wallis Bamberg, PA-C  norethindrone (AYGESTIN) 5 MG tablet Take 1 tablet (5 mg total) by mouth daily. For stage 4 endometriosis suppression 05/31/22   Garfield Bing, MD  omeprazole (PRILOSEC) 20 MG capsule Take 20 mg by mouth 2 (two) times daily as needed.    [provider]                                                                                                                                    Past Surgical History Past Surgical History:  Procedure Laterality Date   FOOT SURGERY Left 2011   LAPAROSCOPIC BILATERAL SALPINGECTOMY Bilateral 03/08/2020   Procedure: LAPAROSCOPIC BILATERAL SALPINGECTOMY, DRAINAGE OF LEFT OVARIAN CYST;  Surgeon: [place] Bing, MD;  Location: [facility] [place];  Service: Gynecology;  Laterality: Bilateral;   Family History Family History  Problem Relation Age of Onset   Diabetes Mother        borderline   Hypertension  Father    Diabetes Maternal Grandmother    Hypertension Maternal Grandmother    Diabetes Maternal Grandfather    Hypertension Maternal Grandfather    Alzheimer's disease Paternal Grandfather     Social History Social History   Tobacco Use   Smoking status: Never   Smokeless tobacco: Never  Vaping Use   Vaping status: Never Used  Substance Use Topics   Alcohol use: Not Currently   Drug use: No   Allergies Patient has no known allergies.  Review of Systems Review of Systems  Musculoskeletal:  Positive for arthralgias, joint swelling and myalgias.    Physical Exam Vital Signs  I have reviewed the triage vital signs Ht 5\' 7"  (1.702 m)   Wt 111.6 kg   BMI 38.53 kg/m   Physical Exam Vitals and nursing note reviewed.  Constitutional:      General: She is not in acute distress.    Appearance: She is well-developed.  HENT:     Head: Normocephalic and atraumatic.  Eyes:     Conjunctiva/sclera: Conjunctivae normal.  Cardiovascular:     Rate and Rhythm: Normal rate and  regular rhythm.     Heart sounds: No murmur heard. Pulmonary:     Effort: Pulmonary effort is normal. No respiratory distress.     Breath sounds: Normal breath sounds.  Abdominal:     Palpations: Abdomen is soft.     Tenderness: There is no abdominal tenderness.  Musculoskeletal:        General: Swelling and tenderness present.     Cervical back: Neck supple.  Skin:    General: Skin is warm and dry.     Capillary Refill: Capillary refill takes less than 2 seconds.  Neurological:     Mental Status: She is alert.  Psychiatric:        Mood and Affect: Mood normal.     ED Results and Treatments Labs (all labs ordered are listed, but only abnormal results are displayed) Labs Reviewed - No data to display                                                                                                                        Radiology No results found.  Pertinent labs & imaging results that were available during my care of the patient were reviewed by me and considered in my medical decision making (see MDM for details).  Medications Ordered in ED Medications  ketorolac (TORADOL) 15 MG/ML injection 15 mg (has no administration in time range)  Procedures Procedures  (including critical care time)  Medical Decision Making / ED Course   This patient presents to the ED for concern of fall, ankle pain, this involves an extensive number of treatment options, and is a complaint that carries with it a high risk of complications and morbidity.  The differential diagnosis includes fracture, dislocation, hematoma, contusion, sprain  MDM: Patient seen emergency room for evaluation of a fall with ankle pain.  Physical exam with tenderness and swelling over the lateral malleolus on the right.  X-ray imaging of the foot and ankle was reassuringly negative for  fracture.  Patient presentation consistent with an ankle sprain and she was placed in an Aircast and given crutches, weightbearing as tolerated.  She given NSAIDs for pain control and will follow-up outpatient with orthopedics if symptoms not improving.  At this time she does not meet inpatient criteria for admission she is safe for discharge with outpatient follow-up.   Additional history obtained: -Additional history obtained from mother -External records from outside source obtained and reviewed including: Chart review including previous notes, labs, imaging, consultation notes   Imaging Studies ordered: I ordered imaging studies including x-ray foot and ankle I independently visualized and interpreted imaging. I agree with the radiologist interpretation   Medicines ordered and prescription drug management: Meds ordered this encounter  Medications   ketorolac (TORADOL) 15 MG/ML injection 15 mg    -I have reviewed the patients home medicines and have made adjustments as needed  Critical interventions none    Cardiac Monitoring: The patient was maintained on a cardiac monitor.  I personally viewed and interpreted the cardiac monitored which showed an underlying rhythm of: NSR  Social Determinants of Health:  Factors impacting patients care include: none   Reevaluation: After the interventions noted above, I reevaluated the patient and found that they have :improved  Co morbidities that complicate the patient evaluation  Past Medical History:  Diagnosis Date   Anxiety    BV (bacterial vaginosis) 03/16/2015   GERD (gastroesophageal reflux disease)    History of abnormal cervical Pap smear 02/28/2015   History of chlamydia 04/17/2015   History of trichomoniasis 04/17/2015   Ovarian cyst    TOA (tubo-ovarian abscess) 10/09/2019   Wears glasses    Yeast infection 06/09/2014      Dispostion: I considered admission for this patient, but at this time she does not meet inpatient  criteria for admission she is safe for discharge with outpatient follow-up     Final Clinical Impression(s) / ED Diagnoses Final diagnoses:  None     @PCDICTATION @    Glendora Score, MD 07/25/22 [phone]

## 2022-08-01 ENCOUNTER — Other Ambulatory Visit: Payer: Self-pay

## 2022-08-01 ENCOUNTER — Ambulatory Visit (INDEPENDENT_AMBULATORY_CARE_PROVIDER_SITE_OTHER): Payer: 59 | Admitting: Obstetrics and Gynecology

## 2022-08-01 ENCOUNTER — Other Ambulatory Visit (HOSPITAL_COMMUNITY)
Admission: RE | Admit: 2022-08-01 | Discharge: 2022-08-01 | Disposition: A | Discharge status: Home / Self Care | Payer: 59 | Source: Ambulatory Visit | Attending: Obstetrics and Gynecology | Admitting: Obstetrics and Gynecology

## 2022-08-01 VITALS — BP 127/87 | HR 82 | Ht 67.0 in | Wt 250.0 lb

## 2022-08-01 DIAGNOSIS — Z01419 Encounter for gynecological examination (general) (routine) without abnormal findings: Secondary | ICD-10-CM | POA: Diagnosis not present

## 2022-08-01 DIAGNOSIS — N941 Unspecified dyspareunia: Secondary | ICD-10-CM

## 2022-08-01 DIAGNOSIS — N809 Endometriosis, unspecified: Secondary | ICD-10-CM | POA: Diagnosis not present

## 2022-08-01 DIAGNOSIS — N393 Stress incontinence (female) (male): Secondary | ICD-10-CM

## 2022-08-01 MED ORDER — NORETHINDRONE ACETATE 5 MG PO TABS: 5.0000 mg | ORAL_TABLET | Freq: Every day | ORAL | 6 refills | Status: DC

## 2022-08-01 MED ORDER — ESTRADIOL 0.1 MG/GM VA CREA: TOPICAL_CREAM | VAGINAL | 12 refills | Status: DC

## 2022-08-01 NOTE — Progress Notes (Signed)
Patient here for annual check up. Hx of stage 4 Endometriosis (Op note from 03/08/20)  Patient expressed concerns of painful sex, which she has been dealing with for a "few" years. She explains that she has low libido.  Cohoon believes the painful sex and low libido is caused by birth control pills (Aygestin), that was started early 2022

## 2022-08-01 NOTE — Progress Notes (Signed)
ANNUAL EXAM Patient name: Mackenzie Harris MRN [id number]  Date of birth: 07/05/1990 Chief Complaint:   Gynecologic Exam  History of Present Illness:   Mackenzie Harris is a 32 y.o. G0P0000 being seen today for a routine annual exam.  Current complaints: pain with intercourse  Entry pain - irritated, pain with using vaginal swab and burning sensation  Can be pain deeper; position dependent but not as bad as pain at entry of hte vagina Has had pain for years No prior treatments for the pain Having some vaginal dryness as well  Decreased libido as well but not sure if intrinsic, secondary to meds, or subconscious reaction to pain   Stress incontinence with laugh/sneeze/cough No prior PFPT  Will get BV very easily   Menstrual concerns? No  amenorrhea with aygestin  Breast or nipple changes? No  Contraception use? Yes  Sexually active? No due to pain   No LMP recorded. (Menstrual status: Other).   The pregnancy intention screening data noted above was reviewed. Potential methods of contraception were discussed. The patient elected to proceed with No data recorded.   Last pap     Component Value Date/Time   DIAGPAP  09/21/2020 1153    - Negative for intraepithelial lesion or malignancy (NILM)   HPVHIGH Negative 09/21/2020 1153   ADEQPAP  09/21/2020 1153    Satisfactory for evaluation; transformation zone component PRESENT.    Last mammogram: n/a Last colonoscopy: n/a.      09/21/2020   10:09 AM 03/30/2020    8:22 AM 10/20/2019    8:26 AM 08/28/2016    8:44 AM  Depression screen PHQ 2/9  Decreased Interest 0 0 1 0  Down, Depressed, Hopeless 1 0 0 0  PHQ - 2 Score 1 0 1 0  Altered sleeping 2 0 1   Tired, decreased energy 1 0 1   Change in appetite 1 0 0   Feeling bad or failure about yourself  0 0 0   Trouble concentrating 0 1 1   Moving slowly or fidgety/restless 0 0 0   Suicidal thoughts 0 0 0   PHQ-9 Score 5 1 4          09/21/2020   10:09 AM 03/30/2020    8:23 AM  10/20/2019    8:26 AM  GAD 7 : Generalized Anxiety Score  Nervous, Anxious, on Edge 1 0 1  Control/stop worrying 0 0 0  Worry too much - different things 1 0 1  Trouble relaxing 1 0 1  Restless 0 0 0  Easily annoyed or irritable 1 1 1   Afraid - awful might happen 0 0 0  Total GAD 7 Score 4 1 4      Review of Systems:   Pertinent items are noted in HPI Denies any headaches, blurred vision, fatigue, shortness of breath, chest pain, abdominal pain, abnormal vaginal discharge/itching/odor/irritation, problems with periods, bowel movements, urination, or intercourse unless otherwise stated above. Pertinent History Reviewed:  Reviewed past medical,surgical, social and family history.  Reviewed problem list, medications and allergies. Physical Assessment:   Vitals:   08/01/22 0852  BP: 127/87  Pulse: 82  Weight: 250 lb (113.4 kg)  Height: 5\' 7"  (1.702 m)  Body mass index is 39.16 kg/m.        Physical Examination:   General appearance - well appearing, and in no distress  Mental status - alert, oriented to person, place, and time  Psych:  She has a normal mood and affect  Skin -  warm and dry, normal color, no suspicious lesions noted  Chest - effort normal, all lung fields clear to auscultation bilaterally  Heart - normal rate and regular rhythm  Abdomen - soft, nontender, nondistended, no masses or organomegaly  Pelvic - deferred  Extremities:  No swelling or varicosities noted  Chaperone present for exam  No results found for this or any previous visit (from the past 24 hour(s)).    Assessment & Plan:  1. Well woman exam - Cervical cancer screening: Discussed guidelines. Pap with HPV up to date - GC/CT: accepts - Birth Control: POPs - Breast Health: Encouraged self breast awareness/SBE. Teaching provided.  - F/U 12 months and prn  - HIV Antibody (routine testing w rflx) - RPR - Hepatitis C Antibody - Hepatitis B Surface AntiGEN - Cervicovaginal ancillary only( [facility])  2. Dyspareunia in female Trial of vaginal estrogen for described vaginal allodynia with PFPT for pain and SUI. Noted that effect can take 2-3 months. If no response, can trial topical lidocaine as well, otherwise may need neuromodulator.  - estradiol (ESTRACE) 0.1 MG/GM vaginal cream; Apply 1 gram per vagina every night for 2 weeks, then apply three times a week  Dispense: 30 g; Refill: 12 - Ambulatory referral to Physical Therapy  3. Endometriosis determined by laparoscopy Prior LSC with TOA and endometirosis now well supprssed with aygestin and would like to continue especially since amenorrheic.  - norethindrone (AYGESTIN) 5 MG tablet; Take 1 tablet (5 mg total) by mouth daily. For stage 4 endometriosis suppression  Dispense: 60 tablet; Refill: 6  4. Stress incontinence PFPT for SUI. Suspect pelvic floor dysfunction in the setting of longstanding dysmenorrhea. Noted to hold off on internal PFPT work until dyspareunia/vaginal introital pain improved.   No orders of the defined types were placed in this encounter.   Meds: No orders of the defined types were placed in this encounter.   Follow-up: No follow-ups on file.  Lorriane Shire, MD 08/01/2022 9:18 AM

## 2022-08-02 LAB — CERVICOVAGINAL ANCILLARY ONLY
Chlamydia: NEGATIVE
Comment: NEGATIVE
Comment: NEGATIVE
Comment: NORMAL
Neisseria Gonorrhea: NEGATIVE
Trichomonas: NEGATIVE

## 2022-08-03 LAB — HEPATITIS B SURFACE ANTIGEN: Hepatitis B Surface Ag: NEGATIVE

## 2022-08-03 LAB — HEPATITIS C ANTIBODY: Hep C Virus Ab: NONREACTIVE

## 2022-08-03 LAB — HIV ANTIBODY (ROUTINE TESTING W REFLEX): HIV Screen 4th Generation wRfx: NONREACTIVE

## 2022-08-03 LAB — RPR: RPR Ser Ql: NONREACTIVE

## 2022-08-05 ENCOUNTER — Encounter: Payer: Self-pay | Admitting: Obstetrics and Gynecology

## 2022-08-05 DIAGNOSIS — N941 Unspecified dyspareunia: Secondary | ICD-10-CM

## 2022-08-05 MED ORDER — ESTRADIOL 0.1 MG/GM VA CREA: TOPICAL_CREAM | VAGINAL | 12 refills | Status: DC

## 2022-08-05 NOTE — Addendum Note (Signed)
Addended by: Marjo Bicker on: 08/05/2022 11:32 AM   Modules accepted: Orders

## 2022-08-07 MED ORDER — ESTRADIOL 0.1 MG/GM VA CREA: TOPICAL_CREAM | VAGINAL | 12 refills | Status: DC

## 2022-08-09 ENCOUNTER — Encounter: Payer: Self-pay | Admitting: Orthopedic Surgery

## 2022-08-09 ENCOUNTER — Ambulatory Visit: Payer: 59 | Admitting: Orthopedic Surgery

## 2022-08-09 VITALS — BP 138/87 | HR 91 | Ht 66.0 in | Wt 248.8 lb

## 2022-08-09 DIAGNOSIS — S93401A Sprain of unspecified ligament of right ankle, initial encounter: Secondary | ICD-10-CM | POA: Diagnosis not present

## 2022-08-09 NOTE — Progress Notes (Signed)
New Patient Visit  Assessment: Mackenzie Harris is a 32 y.o. female with the following: Right ankle sprain  Plan: Sustained a Right ankle sprain Elevate as much as possible Use ice to help with swelling and inflammation Medications as needed WBAT Transition out of boot/brace as soon as possible Exercises provided Can review rehab exercises on YouTube as well Symptoms can linger If slow to progress, consider formal PT   Follow-up: Return if symptoms worsen or fail to improve.  Subjective:  Chief Complaint  Patient presents with   right ankle pain    Originally rolled right ankle when walking out of work on 07/10/2022.  On 07/25/2022, walking down porch steps and ankle rolled again causing her to fall. Seen in ED with x-rays.  She was put in an air cast and given crutches. She continues to have swelling right lateral malleolus.   If she is going up the stairs or puts a lot of weight on it, shooting pain medial and lateral. Swells when up on it. Naproxen for swelling.    History of Present Illness: Mackenzie Harris is a 32 y.o. female who presents for evaluation of right ankle pain.  She initially rolled her ankle approximately 1 month ago.  Couple weeks later she had another injury.  She presented to the emergency department.  Radiographs were negative.  She was given a brace.  She use crutches for couple of days.  She has some pain, which is sudden, and not always reproducible.  She will use a brace on her ankle.  She notes swelling throughout the day.  She takes naproxen as needed.  She is not doing any specific exercises.  She was evaluated by her regular doctor, who recommended she present to clinic to ensure that she is doing appropriate exercises.  She wants to know if she can return to the gym.   Review of Systems: No fevers or chills No numbness or tingling No chest pain No shortness of breath No bowel or bladder dysfunction No GI distress No headaches   Medical  History:  Past Medical History:  Diagnosis Date   Anxiety    BV (bacterial vaginosis) 03/16/2015   GERD (gastroesophageal reflux disease)    History of abnormal cervical Pap smear 02/28/2015   History of chlamydia 04/17/2015   History of trichomoniasis 04/17/2015   Ovarian cyst    TOA (tubo-ovarian abscess) 10/09/2019   Wears glasses    Yeast infection 06/09/2014    Past Surgical History:  Procedure Laterality Date   FOOT SURGERY Left 2011   LAPAROSCOPIC BILATERAL SALPINGECTOMY Bilateral 03/08/2020   Procedure: LAPAROSCOPIC BILATERAL SALPINGECTOMY, DRAINAGE OF LEFT OVARIAN CYST;  Surgeon: [place] Bing, MD;  Location: [facility] [place];  Service: Gynecology;  Laterality: Bilateral;    Family History  Problem Relation Age of Onset   Diabetes Mother        borderline   Hypertension Father    Diabetes Maternal Grandmother    Hypertension Maternal Grandmother    Diabetes Maternal Grandfather    Hypertension Maternal Grandfather    Alzheimer's disease Paternal Grandfather    Social History   Tobacco Use   Smoking status: Never   Smokeless tobacco: Never  Vaping Use   Vaping status: Never Used  Substance Use Topics   Alcohol use: Not Currently   Drug use: No    No Known Allergies  Current Meds  Medication Sig   estradiol (ESTRACE) 0.1 MG/GM vaginal cream Apply 1 gram per vagina every night for  2 weeks, then apply three times a week   loratadine (CLARITIN) 10 MG tablet 1 tablet Orally Once a day for 90 days   naproxen (NAPROSYN) 375 MG tablet Take 1 tablet (375 mg total) by mouth 2 (two) times daily.   norethindrone (AYGESTIN) 5 MG tablet Take 1 tablet (5 mg total) by mouth daily. For stage 4 endometriosis suppression   omeprazole (PRILOSEC) 20 MG capsule Take 20 mg by mouth 2 (two) times daily as needed.    Objective: BP 138/87   Pulse 91   Ht 5\' 6"  (1.676 m)   Wt 248 lb 12.8 oz (112.9 kg)   BMI 40.16 kg/m   Physical Exam:  General: Alert and oriented.  and No acute distress. Gait: Right sided antalgic gait.  Evaluation of right ankle demonstrates no swelling.  No bruising.  No point tenderness.  Ankle is stable to inversion and eversion.  Pain is not recreated.  Negative anterior drawer.  Toes warm and well-perfused.  She is ambulating in sandals.  IMAGING: I personally reviewed images previously obtained from the ED   XR of the right ankle from the emergency department are negative for acute fracture or avulsion injury.   New Medications:  No orders of the defined types were placed in this encounter.     Oliver Barre, MD  08/09/2022 9:28 AM

## 2022-08-09 NOTE — Patient Instructions (Signed)
Instructions ° °1.  You have sustained an ankle sprain, or similar exercises that can be treated as an ankle sprain.  **These exercises can also be used as part of recovery from an ankle fracture.  °2.  I encourage you to stay on your feet and gradually remove your walking boot.   °3.  Below are some exercises that you can complete on your own to improve your symptoms.  °4.  As an alternative, you can search for ankle sprain exercises online, and can see some demonstrations on YouTube  °5.  If you are having difficulty with these exercises, we can also prescribe formal physical therapy ° °Ankle Exercises °Ask your health care provider which exercises are safe for you. Do exercises exactly as told by your health care provider and adjust them as directed. It is normal to feel mild stretching, pulling, tightness, or mild discomfort as you do these exercises. Stop right away if you feel sudden pain or your pain gets worse. Do not begin these exercises until told by your health care provider. ° °Stretching and range-of-motion exercises °These exercises warm up your muscles and joints and improve the movement and flexibility of your ankle. These exercises may also help to relieve pain. ° °Dorsiflexion/plantar flexion ° °Sit with your R knee straight or bent. Do not rest your foot on anything. °Flex your left ankle to tilt the top of your foot toward your shin. This is called dorsiflexion. °Hold this position for 5 seconds. °Point your toes downward to tilt the top of your foot away from your shin. This is called plantar flexion. °Hold this position for 5 seconds. °Repeat 10 times. Complete this exercise 2-3 times a day.  As tolerated ° °Ankle alphabet ° °Sit with your R foot supported at your lower leg. °Do not rest your foot on anything. °Make sure your foot has room to move freely. °Think of your R foot as a paintbrush: °Move your foot to trace each letter of the alphabet in the air. Keep your hip and knee still while  you trace the letters. Trace every letter from A to Z. °Make the letters as large as you can without causing or increasing any discomfort. ° °Repeat 2-3 times. Complete this exercise 2-3 times a day. ° ° °Strengthening exercises °These exercises build strength and endurance in your ankle. Endurance is the ability to use your muscles for a long time, even after they get tired. °Dorsiflexors °These are muscles that lift your foot up. °Secure a rubber exercise band or tube to an object, such as a table leg, that will stay still when the band is pulled. Secure the other end around your R foot. °Sit on the floor, facing the object with your R leg extended. The band or tube should be slightly tense when your foot is relaxed. °Slowly flex your R ankle and toes to bring your foot toward your shin. °Hold this position for 5 seconds. °Slowly return your foot to the starting position, controlling the band as you do that. °Repeat 10 times. Complete this exercise 2-3 times a day. ° °Plantar flexors °These are muscles that push your foot down. °Sit on the floor with your R leg extended. °Loop a rubber exercise band or tube around the ball of your R foot. The ball of your foot is on the walking surface, right under your toes. The band or tube should be slightly tense when your foot is relaxed. °Slowly point your toes downward, pushing them away from   you. °Hold this position for 5 seconds. °Slowly release the tension in the band or tube, controlling smoothly until your foot is back in the starting position. °Repeat 10 times. Complete this exercise 2-3 times a day. ° °Towel curls ° °Sit in a chair on a non-carpeted surface, and put your feet on the floor. °Place a towel in front of your feet. °Keeping your heel on the floor, put your R foot on the towel. °Pull the towel toward you by grabbing the towel with your toes and curling them under. Keep your heel on the floor. °Let your toes relax. °Grab the towel again. Keep pulling the  towel until it is completely underneath your foot. °Repeat 10 times. Complete this exercise 2-3 times a day. ° °Standing plantar flexion °This is an exercise in which you use your toes to lift your body's weight while standing. °Stand with your feet shoulder-width apart. °Keep your weight spread evenly over the width of your feet while you rise up on your toes. Use a wall or table to steady yourself if needed, but try not to use it for support. °If this exercise is too easy, try these options: °Shift your weight toward your R leg until you feel challenged. °If told by your health care provider, lift your uninjured leg off the floor. °Hold this position for 5 seconds. °Repeat 10 times. Complete this exercise 2-3 times a day. ° °Tandem walking °Stand with one foot directly in front of the other. °Slowly raise your back foot up, lifting your heel before your toes, and place it directly in front of your other foot. °Continue to walk in this heel-to-toe way. Have a countertop or wall nearby to use if needed to keep your balance, but try not to hold onto anything for support. ° °Repeat 10 times. Complete this exercise 2-3 times a day. ° ° °Document Revised: 09/27/2017 Document Reviewed: 09/29/2017 °Elsevier Patient Education © 2020 [employer] ° °

## 2022-10-05 NOTE — Therapy (Unsigned)
OUTPATIENT PHYSICAL THERAPY FEMALE PELVIC EVALUATION   Patient Name: Eulamae Kunce MRN: [id number] DOB:31-Jan-1990, 32 y.o., female Today's Date: 10/07/2022  END OF SESSION:  PT End of Session - 10/07/22 1405     Visit Number 1    Date for PT Re-Evaluation 01/27/23    Authorization Type [employer]    PT Start Time 1400    PT Stop Time 1440    PT Time Calculation (min) 40 min    Activity Tolerance Patient tolerated treatment well    Behavior During Therapy [facility] for tasks assessed/performed             Past Medical History:  Diagnosis Date   Anxiety    BV (bacterial vaginosis) 03/16/2015   GERD (gastroesophageal reflux disease)    History of abnormal cervical Pap smear 02/28/2015   History of chlamydia 04/17/2015   History of trichomoniasis 04/17/2015   Ovarian cyst    TOA (tubo-ovarian abscess) 10/09/2019   Wears glasses    Yeast infection 06/09/2014   Past Surgical History:  Procedure Laterality Date   FOOT SURGERY Left 2011   LAPAROSCOPIC BILATERAL SALPINGECTOMY Bilateral 03/08/2020   Procedure: LAPAROSCOPIC BILATERAL SALPINGECTOMY, DRAINAGE OF LEFT OVARIAN CYST;  Surgeon: [place] Bing, MD;  Location: [facility] [place];  Service: Gynecology;  Laterality: Bilateral;   Patient Active Problem List   Diagnosis Date Noted   Dyspareunia in female 09/21/2020   Decreased libido 09/21/2020   Pelvic adhesive disease 03/08/2020   Endometriosis determined by laparoscopy 03/08/2020   Left endometrioma 03/08/2020   TOA (tubo-ovarian abscess) 10/09/2019    PCP: [facility]  REFERRING PROVIDER: Lorriane Shire, MD   REFERRING DIAG: N94.10 (ICD-10-CM) - Dyspareunia in female   THERAPY DIAG:  Cramp and spasm  Other lack of coordination  Pelvic pain  Rationale for Evaluation and Treatment: Rehabilitation  ONSET DATE: 2022  SUBJECTIVE:                                                                                                                                                                                            SUBJECTIVE STATEMENT: Pain worse over the years. Patient had her tubes removed and endometriosis.   PAIN:  Are you having pain? Yes NPRS scale: 7-10/10 Pain location: Vaginal  Pain type: burning and sharp Pain description: intermittent   Aggravating factors: penile penetration, vaginal exams, q-tip swab Relieving factors: no vaginal penetration  PRECAUTIONS: None  RED FLAGS: None   WEIGHT BEARING RESTRICTIONS: No  FALLS:  Has patient fallen in last 6 months? Yes. Number of falls 2 time due to her ankles rolling  LIVING ENVIRONMENT: Lives with: lives  with their family  OCCUPATION: office job  PLOF: Independent  PATIENT GOALS: understands what is going on  PERTINENT HISTORY:  Endometriosis; hx of chlamydia and BV, and trichomoniasis; Tobo-ovarian abscess; bilateral laparoscopic salpingectomy 03/08/20 Sexual abuse: No  BOWEL MOVEMENT: Pain with bowel movement: Yes, depends with constipation Type of bowel movement:Type (Bristol Stool Scale) type 1-4, Frequency 1-2 days , Strain Yes, and Splinting none Fully empty rectum: No, feels like she has to have a bowel movement but unable to have one.  Leakage: No Fiber supplement: No  URINATION: Pain with urination: No Fully empty bladder: No, not sure Stream: Strong Urgency: Yes: sometimes she knows she has to urinate then she is not able to hold her urine Frequency: is she does not drink a lot she does not go for hours and other days she drinks a lot will go more frequently; 1-3 times wakes up to urinate Leakage: Coughing, Sneezing, and Laughing Pads: No  INTERCOURSE: Pain with intercourse: Initial Penetration and During Penetration Entry pain - irritated, pain with using vaginal swab and burning sensation  Can be pain deeper; position dependent but not as bad as pain at entry of hte vagina Ability to have vaginal penetration:  Yes: but very painful Climax:  getting harder due patient being on medication.  Marinoff Scale: 3/3  PREGNANCY:none   PROLAPSE: None   OBJECTIVE:   COGNITION: Overall cognitive status: Within functional limits for tasks assessed     SENSATION: Light touch: Appears intact Proprioception: Appears intact   POSTURE: rounded shoulders and forward head  PELVIC ALIGNMENT:  LUMBARAROM/PROM:  A/PROM A/PROM  eval  Flexion Decreased by 25%  Extension Decreased by 25%   (Blank rows = not tested)  LOWER EXTREMITY ROM: bilateral hip ROM is full   LOWER EXTREMITY [id number] Hip strength is 5/5   PALPATION:   General  throughout the abdomen, decreased lower rib cage expansion                External Perineal Exam Q-tip at 7'00 clock                             Internal Pelvic Floor tenderness located throughout the pelvic floor, around the bladder and cervix.   Patient confirms identification and approves PT to assess internal pelvic floor and treatment Yes  PELVIC MMT:   MMT eval  Vaginal 1/5, trouble contraction and bulging the muscle and difficulty with feeling the contraction  Internal Anal Sphincter   External Anal Sphincter   Puborectalis   Diastasis Recti   (Blank rows = not tested)        TONE: increased  PROLAPSE: none  TODAY'S TREATMENT:                                                                                                                              DATE: 10/07/22  EVAL See below   PATIENT  EDUCATION:  10/07/22 Education details: educated patient on using pelvic floor meditation on you tube.  Person educated: Patient Education method: Explanation and you tube Education comprehension: verbalized understanding  HOME EXERCISE PROGRAM: See above.   ASSESSMENT:  CLINICAL IMPRESSION: Patient is a 32 y.o. female who was seen today for physical therapy evaluation and treatment for dyspareunia. Patient reports intermittent pain at level 7-10/10 with penile penetration  at the initial and deep penetration. She has pain with vaginal exam and Q-tip swab for vaginal infections. She reports pain with bowel movements if she is constipated. She feels like she needs to have a bowel movement but unable to have. She will strain at times for a bowel movement. She is not able to feel if she has fully emptied her bladder. She will leak urine with coughing, sneezing, and laughing. Pelvic floor strength is 1/5 and she has difficulty feeling she contracted her pelvic floor. She has difficulty with contracting and bulging the pelvic floor. Q-tip test showed tenderness located at 7:00 O'clock. She has tenderness located throughout her pelvic floor, around the bladder and cervix. Patient has some thickness located on the right anterior vaginal canal. She is not able to engage her abdominals. Patient will benefit from skilled therapy to reduce trigger points, reduce pain, reduce urinary leakage and improve pelvic floor coordination.   OBJECTIVE IMPAIRMENTS: decreased activity tolerance, decreased coordination, decreased strength, increased fascial restrictions, increased muscle spasms, impaired tone, and pain.   ACTIVITY LIMITATIONS: sitting, continence, toileting, and locomotion level  PARTICIPATION LIMITATIONS: meal prep, cleaning, laundry, interpersonal relationship, and community activity  PERSONAL FACTORS: Time since onset of injury/illness/exacerbation and 1-2 comorbidities: Endometriosis; hx of chlamydia and BV, and trichomoniasis; Tobo-ovarian abscess; bilateral laparoscopic salpingectomy 03/08/20  are also affecting patient's functional outcome.   REHAB POTENTIAL: Excellent  CLINICAL DECISION MAKING: Evolving/moderate complexity  EVALUATION COMPLEXITY: Moderate   GOALS: Goals reviewed with patient? Yes  SHORT TERM GOALS: Target date: 11/03/22  Patient is independent with initial HEP for pelvic floor relaxation and elongation.  Baseline: Goal status: INITIAL  2.   Patient is able to perform diaphragmatic breathing with expansion of the lower rib cage.  Baseline:  Goal status: INITIAL  3.  Patient is able to contract her lower abdominals correctly without lifting her lower rib cage.  Baseline:  Goal status: INITIAL  4.  Educated on how to toilet correctly with breath and elongation of pelvic floor Baseline:  Goal status: INITIAL   LONG TERM GOALS: Target date: 01/27/23  Patient is independent with advanced HEP for core and pelvic floor strength.  Baseline:  Goal status: INITIAL  2.  Patient reports straining with bowel movement </= 50% to reduce pressure on the pelvic floor.  Baseline:  Goal status: INITIAL  3.  Patient is able to have feel herself fully empty her bladder and not have to return to urinate after 2-3 hours.  Baseline:  Goal status: INITIAL  4.  Patient reports pain with penile penetration vaginally decreased 1-2/10 due to elongation of tissue.  Baseline:  Goal status: INITIAL  5.  Patient is able to use the vaginal wand to reduce trigger points in the pelvic floor when she has increased pain and to manage pain.  Baseline:  Goal status: INITIAL   PLAN:  PT FREQUENCY: 1-2x/week  PT DURATION: other: 4 months  PLANNED INTERVENTIONS: Therapeutic exercises, Therapeutic activity, Neuromuscular re-education, Patient/Family education, Joint mobilization, Dry Needling, Electrical stimulation, Spinal mobilization, Cryotherapy, Moist heat, Taping, Ultrasound, Biofeedback, and Manual therapy  PLAN FOR NEXT SESSION: manual work to the lower rib cage and work on diaphragmatic breathing, sit on ball to massage pelvic floor, hip stretches, discuss vaginal wand   Eulis Foster, PT 10/07/22 5:17 PM

## 2022-10-07 ENCOUNTER — Ambulatory Visit: Payer: 59 | Attending: Obstetrics and Gynecology | Admitting: Physical Therapy

## 2022-10-07 ENCOUNTER — Encounter: Payer: Self-pay | Admitting: Physical Therapy

## 2022-10-07 ENCOUNTER — Other Ambulatory Visit: Payer: Self-pay

## 2022-10-07 DIAGNOSIS — N941 Unspecified dyspareunia: Secondary | ICD-10-CM | POA: Diagnosis present

## 2022-10-07 DIAGNOSIS — N393 Stress incontinence (female) (male): Secondary | ICD-10-CM | POA: Insufficient documentation

## 2022-10-07 DIAGNOSIS — R102 Pelvic and perineal pain: Secondary | ICD-10-CM | POA: Diagnosis not present

## 2022-10-07 DIAGNOSIS — R278 Other lack of coordination: Secondary | ICD-10-CM

## 2022-10-07 DIAGNOSIS — R279 Unspecified lack of coordination: Secondary | ICD-10-CM | POA: Diagnosis not present

## 2022-10-07 DIAGNOSIS — R252 Cramp and spasm: Secondary | ICD-10-CM | POA: Diagnosis not present

## 2022-11-27 ENCOUNTER — Encounter: Payer: Self-pay | Admitting: Obstetrics and Gynecology

## 2022-12-18 ENCOUNTER — Ambulatory Visit: Payer: 59 | Admitting: Physical Therapy

## 2023-01-06 ENCOUNTER — Encounter: Payer: Self-pay | Admitting: Physical Therapy

## 2023-01-06 ENCOUNTER — Ambulatory Visit: Payer: 59 | Attending: Obstetrics and Gynecology | Admitting: Physical Therapy

## 2023-01-06 DIAGNOSIS — R252 Cramp and spasm: Secondary | ICD-10-CM | POA: Insufficient documentation

## 2023-01-06 DIAGNOSIS — R278 Other lack of coordination: Secondary | ICD-10-CM | POA: Diagnosis present

## 2023-01-06 DIAGNOSIS — R102 Pelvic and perineal pain: Secondary | ICD-10-CM | POA: Insufficient documentation

## 2023-01-06 NOTE — Therapy (Signed)
OUTPATIENT PHYSICAL THERAPY FEMALE PELVIC TREATMENT   Patient Name: Mackenzie Harris MRN: [id number] DOB:17-Aug-1990, 32 y.o., female Today's Date: 01/06/2023  END OF SESSION:  PT End of Session - 01/06/23 0841     Visit Number 2    Date for PT Re-Evaluation 01/27/23    Authorization Type U[employer]    PT Start Time 0845    PT Stop Time 0925    PT Time Calculation (min) 40 min    Activity Tolerance Patient tolerated treatment well    Behavior During Therapy [facility] for tasks assessed/performed             Past Medical History:  Diagnosis Date   Anxiety    BV (bacterial vaginosis) 03/16/2015   GERD (gastroesophageal reflux disease)    History of abnormal cervical Pap smear 02/28/2015   History of chlamydia 04/17/2015   History of trichomoniasis 04/17/2015   Ovarian cyst    TOA (tubo-ovarian abscess) 10/09/2019   Wears glasses    Yeast infection 06/09/2014   Past Surgical History:  Procedure Laterality Date   FOOT SURGERY Left 2011   LAPAROSCOPIC BILATERAL SALPINGECTOMY Bilateral 03/08/2020   Procedure: LAPAROSCOPIC BILATERAL SALPINGECTOMY, DRAINAGE OF LEFT OVARIAN CYST;  Surgeon: Corcovado Bing, MD;  Location: [facility] [place];  Service: Gynecology;  Laterality: Bilateral;   Patient Active Problem List   Diagnosis Date Noted   Dyspareunia in female 09/21/2020   Decreased libido 09/21/2020   Pelvic adhesive disease 03/08/2020   Endometriosis determined by laparoscopy 03/08/2020   Left endometrioma 03/08/2020   TOA (tubo-ovarian abscess) 10/09/2019    PCP: [facility]  REFERRING PROVIDER: Lorriane Shire, MD   REFERRING DIAG: N94.10 (ICD-10-CM) - Dyspareunia in female   THERAPY DIAG:  Cramp and spasm  Other lack of coordination  Pelvic pain  Rationale for Evaluation and Treatment: Rehabilitation  ONSET DATE: 2022  SUBJECTIVE:                                                                                                                                                                                            SUBJECTIVE STATEMENT: Things doing well.   PAIN:  Are you having pain? Yes NPRS scale: 7-10/10 Pain location: Vaginal  Pain type: burning and sharp Pain description: intermittent   Aggravating factors: penile penetration, vaginal exams, q-tip swab Relieving factors: no vaginal penetration  PRECAUTIONS: None  RED FLAGS: None   WEIGHT BEARING RESTRICTIONS: No  FALLS:  Has patient fallen in last 6 months? Yes. Number of falls 2 time due to her ankles rolling  LIVING ENVIRONMENT: Lives with: lives with their family  OCCUPATION: office job  PLOF:  Independent  PATIENT GOALS: understands what is going on  PERTINENT HISTORY:  Endometriosis; hx of chlamydia and BV, and trichomoniasis; Tobo-ovarian abscess; bilateral laparoscopic salpingectomy 03/08/20 Sexual abuse: No  BOWEL MOVEMENT: Pain with bowel movement: Yes, depends with constipation Type of bowel movement:Type (Bristol Stool Scale) type 1-4, Frequency 1-2 days , Strain Yes, and Splinting none Fully empty rectum: No, feels like she has to have a bowel movement but unable to have one.  Leakage: No Fiber supplement: No  URINATION: Pain with urination: No Fully empty bladder: No, not sure Stream: Strong Urgency: Yes: sometimes she knows she has to urinate then she is not able to hold her urine Frequency: is she does not drink a lot she does not go for hours and other days she drinks a lot will go more frequently; 1-3 times wakes up to urinate Leakage: Coughing, Sneezing, and Laughing Pads: No  INTERCOURSE: Pain with intercourse: Initial Penetration and During Penetration Entry pain - irritated, pain with using vaginal swab and burning sensation  Can be pain deeper; position dependent but not as bad as pain at entry of hte vagina Ability to have vaginal penetration:  Yes: but very painful Climax: getting harder due patient being on medication.  Marinoff  Scale: 3/3  PREGNANCY:none   PROLAPSE: None   OBJECTIVE:   COGNITION: Overall cognitive status: Within functional limits for tasks assessed     SENSATION: Light touch: Appears intact Proprioception: Appears intact   POSTURE: rounded shoulders and forward head  PELVIC ALIGNMENT:  LUMBARAROM/PROM:  A/PROM A/PROM  eval  Flexion Decreased by 25%  Extension Decreased by 25%   (Blank rows = not tested)  LOWER EXTREMITY ROM: bilateral hip ROM is full   LOWER EXTREMITY [id number] Hip strength is 5/5   PALPATION:   General  throughout the abdomen, decreased lower rib cage expansion                External Perineal Exam Q-tip at 7'00 clock                             Internal Pelvic Floor tenderness located throughout the pelvic floor, around the bladder and cervix.   Patient confirms identification and approves PT to assess internal pelvic floor and treatment Yes  PELVIC MMT:   MMT eval  Vaginal 1/5, trouble contraction and bulging the muscle and difficulty with feeling the contraction  Internal Anal Sphincter   External Anal Sphincter   Puborectalis   Diastasis Recti   (Blank rows = not tested)        TONE: increased  PROLAPSE: none  TODAY'S TREATMENT:      01/06/23 Manual: Soft tissue mobilization: Circular massage to the abdomen for peristalic motion of the intestines Manual work to the lower abdomen Myofascial release: Fascial release of the suprapubic area to release the tissue Exercises: Stretches/mobility: Sit on ball to massage the pelvic floor  Educated patient on vaginal wand to massage the pelvic floor at home to lengthen the muscles.  Supine piriformis stretch holding 30 sec bil.  Supine trunk rotation pulling leg across the body holding 30 sec bil.  Marjo Bicker pose with diaphragmatic breathing Strengthening: Supine transverse abdominus Supine with legs at 90/90 doing toe taps with abdominal contraction Standing holding 15 # marching  with core engaged and then holding 10 # Supine alternate hip flexion isometric   PATIENT EDUCATION:  10/07/22 Education details: educated patient on using pelvic floor meditation on  you tube.  Person educated: Patient Education method: Explanation and you tube Education comprehension: verbalized understanding  HOME EXERCISE PROGRAM: 01/06/23 Access Code: [email or web address] URL: [email or web address] Date: 01/06/2023 Prepared by: Eulis Foster  Exercises - Supine Figure 4 Piriformis Stretch with Leg Extension  - 1 x daily - 7 x weekly - 1 sets - 2 reps - 30 sec hold - Supine Piriformis Stretch with Leg Straight  - 1 x daily - 7 x weekly - 1 sets - 2 reps - 30 sec hold - Supine Abdominal Wall Massage  - 1 x daily - 7 x weekly - 3 sets - 10 reps - Supine Pelvic Floor Stretch  - 1 x daily - 7 x weekly - 1 sets - 1 reps - 60 sec hold - Diaphragmatic Breathing in Child's Pose with Pelvic Floor Relaxation  - 1 x daily - 7 x weekly - 1 sets - 1 reps - 30 sec hold - Hooklying Isometric Hip Flexion  - 1 x daily - 7 x weekly - 2 sets - 10 reps - Standing Hip Flexion March  - 1 x daily - 7 x weekly - 3 sets - 10 reps   ASSESSMENT:  CLINICAL IMPRESSION: Patient is a 32 y.o. female who was seen today for physical therapy  treatment for dyspareunia. She has fascial restrictions in the lower abdominal area. She is learning how to contract her lower abdomen with exercise. She was educated on the vaginal wand to work on the pelvic floor muscles. She has difficulty with diaphragmatic breathing.  Patient will benefit from skilled therapy to reduce trigger points, reduce pain, reduce urinary leakage and improve pelvic floor coordination.   OBJECTIVE IMPAIRMENTS: decreased activity tolerance, decreased coordination, decreased strength, increased fascial restrictions, increased muscle spasms, impaired tone, and pain.   ACTIVITY LIMITATIONS: sitting, continence, toileting, and locomotion  level  PARTICIPATION LIMITATIONS: meal prep, cleaning, laundry, interpersonal relationship, and community activity  PERSONAL FACTORS: Time since onset of injury/illness/exacerbation and 1-2 comorbidities: Endometriosis; hx of chlamydia and BV, and trichomoniasis; Tobo-ovarian abscess; bilateral laparoscopic salpingectomy 03/08/20  are also affecting patient's functional outcome.   REHAB POTENTIAL: Excellent  CLINICAL DECISION MAKING: Evolving/moderate complexity  EVALUATION COMPLEXITY: Moderate   GOALS: Goals reviewed with patient? Yes  SHORT TERM GOALS: Target date: 11/03/22  Patient is independent with initial HEP for pelvic floor relaxation and elongation.  Baseline: Goal status: Ongoing 01/06/23  2.  Patient is able to perform diaphragmatic breathing with expansion of the lower rib cage.  Baseline:  Goal status: ongoing 01/06/23  3.  Patient is able to contract her lower abdominals correctly without lifting her lower rib cage.  Baseline:  Goal status: INITIAL  4.  Educated on how to toilet correctly with breath and elongation of pelvic floor Baseline:  Goal status: INITIAL   LONG TERM GOALS: Target date: 01/27/23  Patient is independent with advanced HEP for core and pelvic floor strength.  Baseline:  Goal status: INITIAL  2.  Patient reports straining with bowel movement </= 50% to reduce pressure on the pelvic floor.  Baseline:  Goal status: INITIAL  3.  Patient is able to have feel herself fully empty her bladder and not have to return to urinate after 2-3 hours.  Baseline:  Goal status: INITIAL  4.  Patient reports pain with penile penetration vaginally decreased 1-2/10 due to elongation of tissue.  Baseline:  Goal status: INITIAL  5.  Patient is able to use the vaginal wand to reduce trigger points in the  pelvic floor when she has increased pain and to manage pain.  Baseline:  Goal status: INITIAL   PLAN:  PT FREQUENCY: 1-2x/week  PT DURATION:  other: 4 months  PLANNED INTERVENTIONS: Therapeutic exercises, Therapeutic activity, Neuromuscular re-education, Patient/Family education, Joint mobilization, Dry Needling, Electrical stimulation, Spinal mobilization, Cryotherapy, Moist heat, Taping, Ultrasound, Biofeedback, and Manual therapy  PLAN FOR NEXT SESSION: manual work to the lower rib cage and work on diaphragmatic breathing, continues to work on her gym program, work on the pelvic floor externally  Eulis Foster, PT 01/06/23 9:27 AM

## 2023-01-17 ENCOUNTER — Ambulatory Visit: Payer: 59 | Attending: Obstetrics and Gynecology | Admitting: Physical Therapy

## 2023-01-17 ENCOUNTER — Encounter: Payer: Self-pay | Admitting: Physical Therapy

## 2023-01-17 DIAGNOSIS — R252 Cramp and spasm: Secondary | ICD-10-CM | POA: Insufficient documentation

## 2023-01-17 DIAGNOSIS — R102 Pelvic and perineal pain: Secondary | ICD-10-CM | POA: Diagnosis present

## 2023-01-17 DIAGNOSIS — R278 Other lack of coordination: Secondary | ICD-10-CM | POA: Diagnosis present

## 2023-01-17 NOTE — Therapy (Signed)
 OUTPATIENT PHYSICAL THERAPY FEMALE PELVIC TREATMENT   Patient Name: Mackenzie Harris MRN: [id number] DOB:March 04, 1990, 33 y.o., female Today's Date: 01/17/2023  END OF SESSION:  PT End of Session - 01/17/23 0930     Visit Number 3    Date for PT Re-Evaluation 01/27/23    Authorization Type U[employer]    PT Start Time 0930    PT Stop Time 1010    PT Time Calculation (min) 40 min    Activity Tolerance Patient tolerated treatment well    Behavior During Therapy [facility] for tasks assessed/performed             Past Medical History:  Diagnosis Date   Anxiety    BV (bacterial vaginosis) 03/16/2015   GERD (gastroesophageal reflux disease)    History of abnormal cervical Pap smear 02/28/2015   History of chlamydia 04/17/2015   History of trichomoniasis 04/17/2015   Ovarian cyst    TOA (tubo-ovarian abscess) 10/09/2019   Wears glasses    Yeast infection 06/09/2014   Past Surgical History:  Procedure Laterality Date   FOOT SURGERY Left 2011   LAPAROSCOPIC BILATERAL SALPINGECTOMY Bilateral 03/08/2020   Procedure: LAPAROSCOPIC BILATERAL SALPINGECTOMY, DRAINAGE OF LEFT OVARIAN CYST;  Surgeon: Izell Harari, MD;  Location: [facility] [place];  Service: Gynecology;  Laterality: Bilateral;   Patient Active Problem List   Diagnosis Date Noted   Dyspareunia in female 09/21/2020   Decreased libido 09/21/2020   Pelvic adhesive disease 03/08/2020   Endometriosis determined by laparoscopy 03/08/2020   Left endometrioma 03/08/2020   TOA (tubo-ovarian abscess) 10/09/2019    PCP: [facility]  REFERRING PROVIDER: Jeralyn Crutch, MD   REFERRING DIAG: N94.10 (ICD-10-CM) - Dyspareunia in female   THERAPY DIAG:  Cramp and spasm  Other lack of coordination  Pelvic pain  Rationale for Evaluation and Treatment: Rehabilitation  ONSET DATE: 2022  SUBJECTIVE:                                                                                                                                                                                            SUBJECTIVE STATEMENT: I ordered my wand.   PAIN:  Are you having pain? Yes NPRS scale: 5/10 Pain location: Vaginal  Pain type: burning and sharp Pain description: intermittent   Aggravating factors: penile penetration, vaginal exams, q-tip swab Relieving factors: no vaginal penetration  PRECAUTIONS: None  RED FLAGS: None   WEIGHT BEARING RESTRICTIONS: No  FALLS:  Has patient fallen in last 6 months? Yes. Number of falls 2 time due to her ankles rolling  LIVING ENVIRONMENT: Lives with: lives with their family  OCCUPATION: office job  PLOF: Independent  PATIENT GOALS: understands what is going on  PERTINENT HISTORY:  Endometriosis; hx of chlamydia and BV, and trichomoniasis; Tobo-ovarian abscess; bilateral laparoscopic salpingectomy 03/08/20 Sexual abuse: No  BOWEL MOVEMENT: Pain with bowel movement: Yes, depends with constipation Type of bowel movement:Type (Bristol Stool Scale) type 1-4, Frequency 1-2 days , Strain Yes, and Splinting none Fully empty rectum: No, feels like she has to have a bowel movement but unable to have one.  Leakage: No Fiber supplement: No  URINATION: Pain with urination: No Fully empty bladder: No, not sure Stream: Strong Urgency: Yes: sometimes she knows she has to urinate then she is not able to hold her urine Frequency: is she does not drink a lot she does not go for hours and other days she drinks a lot will go more frequently; 1-3 times wakes up to urinate Leakage: Coughing, Sneezing, and Laughing Pads: No  INTERCOURSE: Pain with intercourse: Initial Penetration and During Penetration Entry pain - irritated, pain with using vaginal swab and burning sensation  Can be pain deeper; position dependent but not as bad as pain at entry of hte vagina Ability to have vaginal penetration:  Yes: but very painful Climax: getting harder due patient being on medication.  Marinoff Scale:  3/3  PREGNANCY:none   PROLAPSE: None   OBJECTIVE:   COGNITION: Overall cognitive status: Within functional limits for tasks assessed     SENSATION: Light touch: Appears intact Proprioception: Appears intact   POSTURE: rounded shoulders and forward head  PELVIC ALIGNMENT:  LUMBARAROM/PROM:  A/PROM A/PROM  eval  Flexion Decreased by 25%  Extension Decreased by 25%   (Blank rows = not tested)  LOWER EXTREMITY ROM: bilateral hip ROM is full   LOWER EXTREMITY FFU:[place]jo Hip strength is 5/5   PALPATION:   General  throughout the abdomen, decreased lower rib cage expansion                External Perineal Exam Q-tip at 7'00 clock                             Internal Pelvic Floor tenderness located throughout the pelvic floor, around the bladder and cervix.   Patient confirms identification and approves PT to assess internal pelvic floor and treatment Yes  PELVIC MMT:   MMT eval 01/17/23  Vaginal 1/5, trouble contraction and bulging the muscle and difficulty with feeling the contraction 1/5  Internal Anal Sphincter    External Anal Sphincter    Puborectalis    Diastasis Recti    (Blank rows = not tested)        TONE: increased  PROLAPSE: none  TODAY'S TREATMENT:    01/17/23 Manual: Soft tissue mobilization: Manual work to bilateral hip adductors, along the quadriceps and hamstring Myofascial release: Fascial release along the perineal body Internal pelvic floor techniques: No emotional/communication barriers or cognitive limitation. Patient is motivated to learn. Patient understands and agrees with treatment goals and plan. PT explains patient will be examined in standing, sitting, and lying down to see how their muscles and joints work. When they are ready, they will be asked to remove their underwear so PT can examine their perineum. The patient is also given the option of providing their own chaperone as one is not provided in our facility. The patient  also has the right and is explained the right to defer or refuse any part of the evaluation or treatment including the internal exam.  With the patient's consent, PT will use one gloved finger to gently assess the muscles of the pelvic floor, seeing how well it contracts and relaxes and if there is muscle symmetry. After, the patient will get dressed and PT and patient will discuss exam findings and plan of care. PT and patient discuss plan of care, schedule, attendance policy and HEP activities.  Manual work to the perineal body, posterior fourchette, along the levator ani to reduce the muscle tension and elongate the area  Exercises: Stretches/mobility: Educated patient on model on how to perform the manual work to the perineal body and posterior fourchette.     01/06/23 Manual: Soft tissue mobilization: Circular massage to the abdomen for peristalic motion of the intestines Manual work to the lower abdomen Myofascial release: Fascial release of the suprapubic area to release the tissue Exercises: Stretches/mobility: Sit on ball to massage the pelvic floor  Educated patient on vaginal wand to massage the pelvic floor at home to lengthen the muscles.  Supine piriformis stretch holding 30 sec bil.  Supine trunk rotation pulling leg across the body holding 30 sec bil.  Karolynn pose with diaphragmatic breathing Strengthening: Supine transverse abdominus Supine with legs at 90/90 doing toe taps with abdominal contraction Standing holding 15 # marching with core engaged and then holding 10 # Supine alternate hip flexion isometric   PATIENT EDUCATION:  10/07/22 Education details: educated patient on using pelvic floor meditation on you tube.  Person educated: Patient Education method: Explanation and you tube Education comprehension: verbalized understanding  HOME EXERCISE PROGRAM: 01/06/23 Access Code: [email or web address] URL: [email or web address] Date: 01/06/2023 Prepared by:  Channing Pereyra  Exercises - Supine Figure 4 Piriformis Stretch with Leg Extension  - 1 x daily - 7 x weekly - 1 sets - 2 reps - 30 sec hold - Supine Piriformis Stretch with Leg Straight  - 1 x daily - 7 x weekly - 1 sets - 2 reps - 30 sec hold - Supine Abdominal Wall Massage  - 1 x daily - 7 x weekly - 3 sets - 10 reps - Supine Pelvic Floor Stretch  - 1 x daily - 7 x weekly - 1 sets - 1 reps - 60 sec hold - Diaphragmatic Breathing in Child's Pose with Pelvic Floor Relaxation  - 1 x daily - 7 x weekly - 1 sets - 1 reps - 30 sec hold - Hooklying Isometric Hip Flexion  - 1 x daily - 7 x weekly - 2 sets - 10 reps - Standing Hip Flexion March  - 1 x daily - 7 x weekly - 3 sets - 10 reps   ASSESSMENT:  CLINICAL IMPRESSION: Patient is a 33 y.o. female who was seen today for physical therapy  treatment for dyspareunia. Patient had trigger points in the perineal body, along the posterior fourchette, and levator ani. Patient has ordered the wand to work on the pelvic floor.  Pelvic floor strength is 1/5. Patient will benefit from skilled therapy to reduce trigger points, reduce pain, reduce urinary leakage and improve pelvic floor coordination.   OBJECTIVE IMPAIRMENTS: decreased activity tolerance, decreased coordination, decreased strength, increased fascial restrictions, increased muscle spasms, impaired tone, and pain.   ACTIVITY LIMITATIONS: sitting, continence, toileting, and locomotion level  PARTICIPATION LIMITATIONS: meal prep, cleaning, laundry, interpersonal relationship, and community activity  PERSONAL FACTORS: Time since onset of injury/illness/exacerbation and 1-2 comorbidities: Endometriosis; hx of chlamydia and BV, and trichomoniasis; Tobo-ovarian abscess; bilateral laparoscopic salpingectomy 03/08/20  are also affecting patient's functional outcome.  REHAB POTENTIAL: Excellent  CLINICAL DECISION MAKING: Evolving/moderate complexity  EVALUATION COMPLEXITY: Moderate   GOALS: Goals  reviewed with patient? Yes  SHORT TERM GOALS: Target date: 11/03/22  Patient is independent with initial HEP for pelvic floor relaxation and elongation.  Baseline: Goal status: Met 01/17/23  2.  Patient is able to perform diaphragmatic breathing with expansion of the lower rib cage.  Baseline:  Goal status: ongoing 01/06/23  3.  Patient is able to contract her lower abdominals correctly without lifting her lower rib cage.  Baseline:  Goal status: INITIAL  4.  Educated on how to toilet correctly with breath and elongation of pelvic floor Baseline:  Goal status: INITIAL   LONG TERM GOALS: Target date: 01/27/23  Patient is independent with advanced HEP for core and pelvic floor strength.  Baseline:  Goal status: INITIAL  2.  Patient reports straining with bowel movement </= 50% to reduce pressure on the pelvic floor.  Baseline:  Goal status: INITIAL  3.  Patient is able to have feel herself fully empty her bladder and not have to return to urinate after 2-3 hours.  Baseline:  Goal status: INITIAL  4.  Patient reports pain with penile penetration vaginally decreased 1-2/10 due to elongation of tissue.  Baseline:  Goal status: INITIAL  5.  Patient is able to use the vaginal wand to reduce trigger points in the pelvic floor when she has increased pain and to manage pain.  Baseline:  Goal status: INITIAL   PLAN:  PT FREQUENCY: 1-2x/week  PT DURATION: other: 4 months  PLANNED INTERVENTIONS: Therapeutic exercises, Therapeutic activity, Neuromuscular re-education, Patient/Family education, Joint mobilization, Dry Needling, Electrical stimulation, Spinal mobilization, Cryotherapy, Moist heat, Taping, Ultrasound, Biofeedback, and Manual therapy  PLAN FOR NEXT SESSION: dry needling to perineal body possible; manual work to the lower rib cage and work on diaphragmatic breathing, see if she has the wand yet, how to have a bowel movement,  work on the pelvic floor  externally  Channing Pereyra, PT 01/17/23 11:22 AM

## 2023-01-22 ENCOUNTER — Encounter: Payer: Self-pay | Admitting: Physical Therapy

## 2023-01-22 ENCOUNTER — Ambulatory Visit: Payer: 59 | Admitting: Physical Therapy

## 2023-01-22 DIAGNOSIS — R252 Cramp and spasm: Secondary | ICD-10-CM | POA: Diagnosis not present

## 2023-01-22 DIAGNOSIS — R278 Other lack of coordination: Secondary | ICD-10-CM

## 2023-01-22 DIAGNOSIS — R102 Pelvic and perineal pain: Secondary | ICD-10-CM

## 2023-01-22 NOTE — Patient Instructions (Signed)

## 2023-01-22 NOTE — Therapy (Signed)
 OUTPATIENT PHYSICAL THERAPY FEMALE PELVIC TREATMENT   Patient Name: Mackenzie Harris MRN: [id number] DOB:Jul 20, 1990, 33 y.o., female Today's Date: 01/22/2023  END OF SESSION:  PT End of Session - 01/22/23 1400     Visit Number 4    Date for PT Re-Evaluation 01/27/23    Authorization Type [employer]    PT Start Time 1400    PT Stop Time 1440    PT Time Calculation (min) 40 min    Activity Tolerance Patient tolerated treatment well    Behavior During Therapy [facility] for tasks assessed/performed             Past Medical History:  Diagnosis Date   Anxiety    BV (bacterial vaginosis) 03/16/2015   GERD (gastroesophageal reflux disease)    History of abnormal cervical Pap smear 02/28/2015   History of chlamydia 04/17/2015   History of trichomoniasis 04/17/2015   Ovarian cyst    TOA (tubo-ovarian abscess) 10/09/2019   Wears glasses    Yeast infection 06/09/2014   Past Surgical History:  Procedure Laterality Date   FOOT SURGERY Left 2011   LAPAROSCOPIC BILATERAL SALPINGECTOMY Bilateral 03/08/2020   Procedure: LAPAROSCOPIC BILATERAL SALPINGECTOMY, DRAINAGE OF LEFT OVARIAN CYST;  Surgeon: Izell Harari, MD;  Location: [facility];  Service: Gynecology;  Laterality: Bilateral;   Patient Active Problem List   Diagnosis Date Noted   Dyspareunia in female 09/21/2020   Decreased libido 09/21/2020   Pelvic adhesive disease 03/08/2020   Endometriosis determined by laparoscopy 03/08/2020   Left endometrioma 03/08/2020   TOA (tubo-ovarian abscess) 10/09/2019    PCP: [facility]  REFERRING PROVIDER: Jeralyn Crutch, MD   REFERRING DIAG: N94.10 (ICD-10-CM) - Dyspareunia in female   THERAPY DIAG:  Cramp and spasm  Other lack of coordination  Pelvic pain  Rationale for Evaluation and Treatment: Rehabilitation  ONSET DATE: 2022  SUBJECTIVE:                                                                                                                                                                                            SUBJECTIVE STATEMENT: The wand came in.   PAIN:  Are you having pain? Yes NPRS scale: 5/10 Pain location: Vaginal  Pain type: burning and sharp Pain description: intermittent   Aggravating factors: penile penetration, vaginal exams, q-tip swab Relieving factors: no vaginal penetration  PRECAUTIONS: None  RED FLAGS: None   WEIGHT BEARING RESTRICTIONS: No  FALLS:  Has patient fallen in last 6 months? Yes. Number of falls 2 time due to her ankles rolling  LIVING ENVIRONMENT: Lives with: lives with their family  OCCUPATION: office job  PLOF: Independent  PATIENT GOALS: understands what is going on  PERTINENT HISTORY:  Endometriosis; hx of chlamydia and BV, and trichomoniasis; Tobo-ovarian abscess; bilateral laparoscopic salpingectomy 03/08/20 Sexual abuse: No  BOWEL MOVEMENT: Pain with bowel movement: Yes, depends with constipation Type of bowel movement:Type (Bristol Stool Scale) type 1-4, Frequency 1-2 days , Strain Yes, and Splinting none Fully empty rectum: No, feels like she has to have a bowel movement but unable to have one.  Leakage: No Fiber supplement: No  URINATION: Pain with urination: No Fully empty bladder: No, not sure Stream: Strong Urgency: Yes: sometimes she knows she has to urinate then she is not able to hold her urine Frequency: is she does not drink a lot she does not go for hours and other days she drinks a lot will go more frequently; 1-3 times wakes up to urinate Leakage: Coughing, Sneezing, and Laughing Pads: No  INTERCOURSE: Pain with intercourse: Initial Penetration and During Penetration Entry pain - irritated, pain with using vaginal swab and burning sensation  Can be pain deeper; position dependent but not as bad as pain at entry of hte vagina Ability to have vaginal penetration:  Yes: but very painful Climax: getting harder due patient being on medication.  Marinoff Scale:  3/3  PREGNANCY:none   PROLAPSE: None   OBJECTIVE:   COGNITION: Overall cognitive status: Within functional limits for tasks assessed     SENSATION: Light touch: Appears intact Proprioception: Appears intact   POSTURE: rounded shoulders and forward head  PELVIC ALIGNMENT:correct alignment  LUMBARAROM/PROM:  A/PROM A/PROM  eval  Flexion Decreased by 25%  Extension Decreased by 25%   (Blank rows = not tested)  LOWER EXTREMITY ROM: bilateral hip ROM is full   LOWER EXTREMITY FFU:[place] Hip strength is 5/5   PALPATION:   General  throughout the abdomen, decreased lower rib cage expansion                External Perineal Exam Q-tip at 7'00 clock                             Internal Pelvic Floor tenderness located throughout the pelvic floor, around the bladder and cervix.   Patient confirms identification and approves PT to assess internal pelvic floor and treatment Yes  PELVIC MMT:   MMT eval 01/17/23  Vaginal 1/5, trouble contraction and bulging the muscle and difficulty with feeling the contraction 1/5  Internal Anal Sphincter    External Anal Sphincter    Puborectalis    Diastasis Recti    (Blank rows = not tested)        TONE: increased  PROLAPSE: none  TODAY'S TREATMENT:   01/22/23 Manual: Soft tissue mobilization: To assess for dry needling Manual work to perineal body, ischiocavernosus, and superior transverse to elongate after dry needling.  Internal pelvic floor techniques: No emotional/communication barriers or cognitive limitation. Patient is motivated to learn. Patient understands and agrees with treatment goals and plan. PT explains patient will be examined in standing, sitting, and lying down to see how their muscles and joints work. When they are ready, they will be asked to remove their underwear so PT can examine their perineum. The patient is also given the option of providing their own chaperone as one is not provided in our facility.  The patient also has the right and is explained the right to defer or refuse any part of the evaluation or treatment including the internal  exam. With the patient's consent, PT will use one gloved finger to gently assess the muscles of the pelvic floor, seeing how well it contracts and relaxes and if there is muscle symmetry. After, the patient will get dressed and PT and patient will discuss exam findings and plan of care. PT and patient discuss plan of care, schedule, attendance policy and HEP activities.  Going through the vaginal canal working on the ischiococcygeus and obturator internist Trigger Point Dry Needling  Initial Treatment: Pt instructed on Dry Needling rational, procedures, and possible side effects. Pt instructed to expect mild to moderate muscle soreness later in the day and/or into the next day.  Pt instructed in methods to reduce muscle soreness. Pt instructed to continue prescribed HEP. Patient was educated on signs and symptoms of infection and other risk factors and advised to seek medical attention should they occur.  Patient verbalized understanding of these instructions and education.   Patient Verbal Consent Given: Yes Education Handout Provided: Yes Muscles Treated: perineal body, transverse perineum, ischiocavernosus  Electrical Stimulation Performed: No Treatment Response/Outcome: elongation of muscles and trigger point response  Exercises: Stretches/mobility: Sitting on ball and doing the diaphragmatic breathing to feel the pelvic floor drop into the ball, places hands on lower rib cage to feel the expansion Sitting on ball and lean to one side with other hand pushing the rib cage to the side she is reaching toward  Sitting on ball and hip flexor stretch holding 30 sec bil. x2    01/17/23 Manual: Soft tissue mobilization: Manual work to bilateral hip adductors, along the quadriceps and hamstring Myofascial release: Fascial release along the perineal  body Internal pelvic floor techniques: No emotional/communication barriers or cognitive limitation. Patient is motivated to learn. Patient understands and agrees with treatment goals and plan. PT explains patient will be examined in standing, sitting, and lying down to see how their muscles and joints work. When they are ready, they will be asked to remove their underwear so PT can examine their perineum. The patient is also given the option of providing their own chaperone as one is not provided in our facility. The patient also has the right and is explained the right to defer or refuse any part of the evaluation or treatment including the internal exam. With the patient's consent, PT will use one gloved finger to gently assess the muscles of the pelvic floor, seeing how well it contracts and relaxes and if there is muscle symmetry. After, the patient will get dressed and PT and patient will discuss exam findings and plan of care. PT and patient discuss plan of care, schedule, attendance policy and HEP activities.  Manual work to the perineal body, posterior fourchette, along the levator ani to reduce the muscle tension and elongate the area  Exercises: Stretches/mobility: Educated patient on model on how to perform the manual work to the perineal body and posterior fourchette.     01/06/23 Manual: Soft tissue mobilization: Circular massage to the abdomen for peristalic motion of the intestines Manual work to the lower abdomen Myofascial release: Fascial release of the suprapubic area to release the tissue Exercises: Stretches/mobility: Sit on ball to massage the pelvic floor  Educated patient on vaginal wand to massage the pelvic floor at home to lengthen the muscles.  Supine piriformis stretch holding 30 sec bil.  Supine trunk rotation pulling leg across the body holding 30 sec bil.  Karolynn pose with diaphragmatic breathing Strengthening: Supine transverse abdominus Supine with legs at  90/90  doing toe taps with abdominal contraction Standing holding 15 # marching with core engaged and then holding 10 # Supine alternate hip flexion isometric   PATIENT EDUCATION:  10/07/22 Education details: educated patient on using pelvic floor meditation on you tube.  Person educated: Patient Education method: Explanation and you tube Education comprehension: verbalized understanding  HOME EXERCISE PROGRAM: 01/06/23 Access Code: [email or web address] URL: [email or web address] Date: 01/06/2023 Prepared by: Channing Pereyra  Exercises - Supine Figure 4 Piriformis Stretch with Leg Extension  - 1 x daily - 7 x weekly - 1 sets - 2 reps - 30 sec hold - Supine Piriformis Stretch with Leg Straight  - 1 x daily - 7 x weekly - 1 sets - 2 reps - 30 sec hold - Supine Abdominal Wall Massage  - 1 x daily - 7 x weekly - 3 sets - 10 reps - Supine Pelvic Floor Stretch  - 1 x daily - 7 x weekly - 1 sets - 1 reps - 60 sec hold - Diaphragmatic Breathing in Child's Pose with Pelvic Floor Relaxation  - 1 x daily - 7 x weekly - 1 sets - 1 reps - 30 sec hold - Hooklying Isometric Hip Flexion  - 1 x daily - 7 x weekly - 2 sets - 10 reps - Standing Hip Flexion March  - 1 x daily - 7 x weekly - 3 sets - 10 reps   ASSESSMENT:  CLINICAL IMPRESSION: Patient is a 33 y.o. female who was seen today for physical therapy  treatment for dyspareunia. She responded well with the dry needling and had relaxation of the pelvic floor. She was able to feel the pelvic floor relax while sitting on the physioball. She understands how to use the vaginal wand and watched videos. . Patient will benefit from skilled therapy to reduce trigger points, reduce pain, reduce urinary leakage and improve pelvic floor coordination.   OBJECTIVE IMPAIRMENTS: decreased activity tolerance, decreased coordination, decreased strength, increased fascial restrictions, increased muscle spasms, impaired tone, and pain.   ACTIVITY LIMITATIONS:  sitting, continence, toileting, and locomotion level  PARTICIPATION LIMITATIONS: meal prep, cleaning, laundry, interpersonal relationship, and community activity  PERSONAL FACTORS: Time since onset of injury/illness/exacerbation and 1-2 comorbidities: Endometriosis; hx of chlamydia and BV, and trichomoniasis; Tobo-ovarian abscess; bilateral laparoscopic salpingectomy 03/08/20  are also affecting patient's functional outcome.   REHAB POTENTIAL: Excellent  CLINICAL DECISION MAKING: Evolving/moderate complexity  EVALUATION COMPLEXITY: Moderate   GOALS: Goals reviewed with patient? Yes  SHORT TERM GOALS: Target date: 11/03/22  Patient is independent with initial HEP for pelvic floor relaxation and elongation.  Baseline: Goal status: Met 01/17/23  2.  Patient is able to perform diaphragmatic breathing with expansion of the lower rib cage.  Baseline:  Goal status: Met 01/22/23  3.  Patient is able to contract her lower abdominals correctly without lifting her lower rib cage.  Baseline:  Goal status: INITIAL  4.  Educated on how to toilet correctly with breath and elongation of pelvic floor Baseline:  Goal status: INITIAL   LONG TERM GOALS: Target date: 01/27/23  Patient is independent with advanced HEP for core and pelvic floor strength.  Baseline:  Goal status: INITIAL  2.  Patient reports straining with bowel movement </= 50% to reduce pressure on the pelvic floor.  Baseline:  Goal status: INITIAL  3.  Patient is able to have feel herself fully empty her bladder and not have to return to urinate after 2-3 hours.  Baseline:  Goal status: INITIAL  4.  Patient reports pain with penile penetration vaginally decreased 1-2/10 due to elongation of tissue.  Baseline:  Goal status: INITIAL  5.  Patient is able to use the vaginal wand to reduce trigger points in the pelvic floor when she has increased pain and to manage pain.  Baseline:  Goal status: INITIAL   PLAN:  PT  FREQUENCY: 1-2x/week  PT DURATION: other: 4 months  PLANNED INTERVENTIONS: Therapeutic exercises, Therapeutic activity, Neuromuscular re-education, Patient/Family education, Joint mobilization, Dry Needling, Electrical stimulation, Spinal mobilization, Cryotherapy, Moist heat, Taping, Ultrasound, Biofeedback, and Manual therapy  PLAN FOR NEXT SESSION: dry needling to perineal body possible;  how to have a bowel movement,  work on the pelvic floor externally  Channing Pereyra, PT 01/22/23 2:46 PM

## 2023-01-27 ENCOUNTER — Ambulatory Visit: Payer: 59 | Admitting: Physical Therapy

## 2023-01-27 ENCOUNTER — Encounter: Payer: Self-pay | Admitting: Physical Therapy

## 2023-01-27 DIAGNOSIS — R252 Cramp and spasm: Secondary | ICD-10-CM | POA: Diagnosis not present

## 2023-01-27 DIAGNOSIS — R278 Other lack of coordination: Secondary | ICD-10-CM

## 2023-01-27 DIAGNOSIS — R102 Pelvic and perineal pain: Secondary | ICD-10-CM

## 2023-01-27 NOTE — Patient Instructions (Signed)
 How To Poop Better:  What are Good Poops? There is no one exact normal, but they should be REGULAR.  This varies from person to person and ranges from up to 3x/day or as little as 3-4/week.  This should stay consistent for you.  They should be formed and ideally one solid mass that doesn't fall apart or dissolve in the water and is brown in color.  Lifestyle Tips:  Fiber: Eat 25-31 grams per day Do not hold it.  If you need to go, GO! Try to go every day around the same time Walk and move more Probiotics for more healthy gut bacteria Water and fluids: half of your healthy body weight in ounces  Toileting Tips:  Posture: knees above hips, back flat, look straight ahead, RELAX Relax all the muscles from your face down to your toes Breathe: slow deep breaths into your belly and pelvic floor is RELAXED Blow: Tighten belly and blow like blowing up a balloon, make "SH" sound, make a vowel sound with a deep voice Do NOT sit more than 10 minutes After you are finished, tighten the muscles to reset pelvic floor back to normal Sometimes can move your hips in circles, anterior and posterior rock, massage belly pulling the tissue forward    [facility] [address] [place], [place] [address] Phone # [phone] Fax [phone]

## 2023-01-27 NOTE — Therapy (Addendum)
 OUTPATIENT PHYSICAL THERAPY FEMALE PELVIC TREATMENT   Patient Name: Mackenzie Harris MRN: [id number] DOB:1990/02/05, 33 y.o., female Today's Date: 01/27/2023  END OF SESSION:  PT End of Session - 01/27/23 1448     Visit Number 5    Date for PT Re-Evaluation 07/27/23    Authorization Type [employer]    PT Start Time 1445    PT Stop Time 1525    PT Time Calculation (min) 40 min    Behavior During Therapy [facility] for tasks assessed/performed             Past Medical History:  Diagnosis Date   Anxiety    BV (bacterial vaginosis) 03/16/2015   GERD (gastroesophageal reflux disease)    History of abnormal cervical Pap smear 02/28/2015   History of chlamydia 04/17/2015   History of trichomoniasis 04/17/2015   Ovarian cyst    TOA (tubo-ovarian abscess) 10/09/2019   Wears glasses    Yeast infection 06/09/2014   Past Surgical History:  Procedure Laterality Date   FOOT SURGERY Left 2011   LAPAROSCOPIC BILATERAL SALPINGECTOMY Bilateral 03/08/2020   Procedure: LAPAROSCOPIC BILATERAL SALPINGECTOMY, DRAINAGE OF LEFT OVARIAN CYST;  Surgeon: [place] Bing, MD;  Location: [facility];  Service: Gynecology;  Laterality: Bilateral;   Patient Active Problem List   Diagnosis Date Noted   Dyspareunia in female 09/21/2020   Decreased libido 09/21/2020   Pelvic adhesive disease 03/08/2020   Endometriosis determined by laparoscopy 03/08/2020   Left endometrioma 03/08/2020   TOA (tubo-ovarian abscess) 10/09/2019    PCP: [facility]  REFERRING PROVIDER: Lorriane Shire, MD   REFERRING DIAG: N94.10 (ICD-10-CM) - Dyspareunia in female   THERAPY DIAG:  Cramp and spasm - Plan: PT plan of care cert/re-cert  Other lack of coordination - Plan: PT plan of care cert/re-cert  Pelvic pain - Plan: PT plan of care cert/re-cert  Rationale for Evaluation and Treatment: Rehabilitation  ONSET DATE: 2022  SUBJECTIVE:                                                                                                                                                                                            SUBJECTIVE STATEMENT: I tried the wand and it went well. I have been constipated for several days. I have been straining to have a bowel movement. I had no pain with using the wand.    PAIN:  Are you having pain? Yes NPRS scale: 5/10 Pain location: Vaginal  Pain type: burning and sharp Pain description: intermittent   Aggravating factors: penile penetration, vaginal exams, q-tip swab Relieving factors: no vaginal penetration  PRECAUTIONS: None  RED FLAGS: None  WEIGHT BEARING RESTRICTIONS: No  FALLS:  Has patient fallen in last 6 months? Yes. Number of falls 2 time due to her ankles rolling  LIVING ENVIRONMENT: Lives with: lives with their family  OCCUPATION: office job  PLOF: Independent  PATIENT GOALS: understands what is going on  PERTINENT HISTORY:  Endometriosis; hx of chlamydia and BV, and trichomoniasis; Tobo-ovarian abscess; bilateral laparoscopic salpingectomy 03/08/20 Sexual abuse: No  BOWEL MOVEMENT: Pain with bowel movement: Yes, depends with constipation Type of bowel movement:Type (Bristol Stool Scale) type 1-4, Frequency 1-2 days , Strain Yes, and Splinting none Fully empty rectum: No, feels like she has to have a bowel movement but unable to have one.  Leakage: No Fiber supplement: No  URINATION: Pain with urination: No Fully empty bladder: No, not sure Stream: Strong Urgency: Yes: sometimes she knows she has to urinate then she is not able to hold her urine Frequency: is she does not drink a lot she does not go for hours and other days she drinks a lot will go more frequently; 1-3 times wakes up to urinate Leakage: Coughing, Sneezing, and Laughing Pads: No  INTERCOURSE: Pain with intercourse: Initial Penetration and During Penetration Entry pain - irritated, pain with using vaginal swab and burning sensation  Can be pain deeper;  position dependent but not as bad as pain at entry of hte vagina Ability to have vaginal penetration:  Yes: but very painful Climax: getting harder due patient being on medication.  Marinoff Scale: 3/3  PREGNANCY:none   PROLAPSE: None   OBJECTIVE:   COGNITION: Overall cognitive status: Within functional limits for tasks assessed     SENSATION: Light touch: Appears intact Proprioception: Appears intact   POSTURE: rounded shoulders and forward head  PELVIC ALIGNMENT:correct alignment  LUMBARAROM/PROM:  A/PROM A/PROM  eval 01/27/23  Flexion Decreased by 25% Full with tightness  Extension Decreased by 25% full with tightness   (Blank rows = not tested)  LOWER EXTREMITY ROM: bilateral hip ROM is full   LOWER EXTREMITY WUX:[id number] Hip strength is 5/5   PALPATION:   General  throughout the abdomen, decreased lower rib cage expansion                External Perineal Exam Q-tip at 7'00 clock                             Internal Pelvic Floor tenderness located throughout the pelvic floor, around the bladder and cervix.   Patient confirms identification and approves PT to assess internal pelvic floor and treatment Yes  PELVIC MMT:   MMT eval 01/17/23  Vaginal 1/5, trouble contraction and bulging the muscle and difficulty with feeling the contraction 1/5  Internal Anal Sphincter    External Anal Sphincter    Puborectalis    Diastasis Recti    (Blank rows = not tested)        TONE: increased  PROLAPSE: none  TODAY'S TREATMENT:   01/27/23 Manual: Myofascial release: Fascial release of the perineal body, first layer of the urogenital diaphragm, along the transverse perineum Internal pelvic floor techniques: No emotional/communication barriers or cognitive limitation. Patient is motivated to learn. Patient understands and agrees with treatment goals and plan. PT explains patient will be examined in standing, sitting, and lying down to see how their muscles and  joints work. When they are ready, they will be asked to remove their underwear so PT can examine their perineum. The patient is also  given the option of providing their own chaperone as one is not provided in our facility. The patient also has the right and is explained the right to defer or refuse any part of the evaluation or treatment including the internal exam. With the patient's consent, PT will use one gloved finger to gently assess the muscles of the pelvic floor, seeing how well it contracts and relaxes and if there is muscle symmetry. After, the patient will get dressed and PT and patient will discuss exam findings and plan of care. PT and patient discuss plan of care, schedule, attendance policy and HEP activities.  Therapist finger going through the vaginal canal working on the iliococcygeus with hip movements, along the posterior vaginal canal, and sides of the bladder Trigger Point Dry Needling  Subsequent Treatment: Instructions provided previously at initial dry needling treatment.   Patient Verbal Consent Given: Yes Education Handout Provided: Previously Provided Muscles Treated: perineal body, superior transverse perineum, puborectalis Electrical Stimulation Performed: No Treatment Response/Outcome: elongation of muscle and trigger point response Neuromuscular re-education: Pelvic floor contraction training: Therapist finger in the vaginal canal working on pelvic floor contraction with pulling up the bladder Therapeutic activities: Functional strengthening activities: Educated patient on toileting, knees above the hips, breathing out to generating pressure    01/22/23 Manual: Soft tissue mobilization: To assess for dry needling Manual work to perineal body, ischiocavernosus, and superior transverse to elongate after dry needling.  Internal pelvic floor techniques: No emotional/communication barriers or cognitive limitation. Patient is motivated to learn. Patient understands  and agrees with treatment goals and plan. PT explains patient will be examined in standing, sitting, and lying down to see how their muscles and joints work. When they are ready, they will be asked to remove their underwear so PT can examine their perineum. The patient is also given the option of providing their own chaperone as one is not provided in our facility. The patient also has the right and is explained the right to defer or refuse any part of the evaluation or treatment including the internal exam. With the patient's consent, PT will use one gloved finger to gently assess the muscles of the pelvic floor, seeing how well it contracts and relaxes and if there is muscle symmetry. After, the patient will get dressed and PT and patient will discuss exam findings and plan of care. PT and patient discuss plan of care, schedule, attendance policy and HEP activities.  Going through the vaginal canal working on the ischiococcygeus and obturator internist Trigger Point Dry Needling  Initial Treatment: Pt instructed on Dry Needling rational, procedures, and possible side effects. Pt instructed to expect mild to moderate muscle soreness later in the day and/or into the next day.  Pt instructed in methods to reduce muscle soreness. Pt instructed to continue prescribed HEP. Patient was educated on signs and symptoms of infection and other risk factors and advised to seek medical attention should they occur.  Patient verbalized understanding of these instructions and education.   Patient Verbal Consent Given: Yes Education Handout Provided: Yes Muscles Treated: perineal body, transverse perineum, ischiocavernosus  Electrical Stimulation Performed: No Treatment Response/Outcome: elongation of muscles and trigger point response  Exercises: Stretches/mobility: Sitting on ball and doing the diaphragmatic breathing to feel the pelvic floor drop into the ball, places hands on lower rib cage to feel the  expansion Sitting on ball and lean to one side with other hand pushing the rib cage to the side she is reaching toward  Sitting  on ball and hip flexor stretch holding 30 sec bil. x2    01/17/23 Manual: Soft tissue mobilization: Manual work to bilateral hip adductors, along the quadriceps and hamstring Myofascial release: Fascial release along the perineal body Internal pelvic floor techniques: No emotional/communication barriers or cognitive limitation. Patient is motivated to learn. Patient understands and agrees with treatment goals and plan. PT explains patient will be examined in standing, sitting, and lying down to see how their muscles and joints work. When they are ready, they will be asked to remove their underwear so PT can examine their perineum. The patient is also given the option of providing their own chaperone as one is not provided in our facility. The patient also has the right and is explained the right to defer or refuse any part of the evaluation or treatment including the internal exam. With the patient's consent, PT will use one gloved finger to gently assess the muscles of the pelvic floor, seeing how well it contracts and relaxes and if there is muscle symmetry. After, the patient will get dressed and PT and patient will discuss exam findings and plan of care. PT and patient discuss plan of care, schedule, attendance policy and HEP activities.  Manual work to the perineal body, posterior fourchette, along the levator ani to reduce the muscle tension and elongate the area  Exercises: Stretches/mobility: Educated patient on model on how to perform the manual work to the perineal body and posterior fourchette.    PATIENT EDUCATION:  10/07/22 Education details: educated patient on using pelvic floor meditation on you tube.  Person educated: Patient Education method: Explanation and you tube Education comprehension: verbalized understanding  HOME EXERCISE  PROGRAM: 01/06/23 Access Code: [email or web address] URL: [email or web address] Date: 01/06/2023 Prepared by: Eulis Foster  Exercises - Supine Figure 4 Piriformis Stretch with Leg Extension  - 1 x daily - 7 x weekly - 1 sets - 2 reps - 30 sec hold - Supine Piriformis Stretch with Leg Straight  - 1 x daily - 7 x weekly - 1 sets - 2 reps - 30 sec hold - Supine Abdominal Wall Massage  - 1 x daily - 7 x weekly - 3 sets - 10 reps - Supine Pelvic Floor Stretch  - 1 x daily - 7 x weekly - 1 sets - 1 reps - 60 sec hold - Diaphragmatic Breathing in Child's Pose with Pelvic Floor Relaxation  - 1 x daily - 7 x weekly - 1 sets - 1 reps - 30 sec hold - Hooklying Isometric Hip Flexion  - 1 x daily - 7 x weekly - 2 sets - 10 reps - Standing Hip Flexion March  - 1 x daily - 7 x weekly - 3 sets - 10 reps   ASSESSMENT:  CLINICAL IMPRESSION: Patient is a 33 y.o. female who was seen today for physical therapy  treatment for dyspareunia. Patient still strains with bowel movements. She responded well with the dry needling and had relaxation of the pelvic floor. She was able to feel the pelvic floor relax while sitting on the physioball. She understands how to use the vaginal wand and watched videos. She is able to relax her pelvic floor and bulge it partially with diaphragmatic breathing. Pelvic floor strength is 2/5 but has to concentrate on contraction due to the tight muscles. She has tightness in the iliococcygeus on the right. Patient has full lumbar ROM but tightness at the end range. She was able to breath in sitting and feel the  pressure in the rectum as she is pushing the stool out today in therapy. She does have tightness in the posterior right hip.  Patient will benefit from skilled therapy to reduce trigger points, reduce pain, reduce urinary leakage and improve pelvic floor coordination.   OBJECTIVE IMPAIRMENTS: decreased activity tolerance, decreased coordination, decreased strength, increased fascial  restrictions, increased muscle spasms, impaired tone, and pain.   ACTIVITY LIMITATIONS: sitting, continence, toileting, and locomotion level  PARTICIPATION LIMITATIONS: meal prep, cleaning, laundry, interpersonal relationship, and community activity  PERSONAL FACTORS: Time since onset of injury/illness/exacerbation and 1-2 comorbidities: Endometriosis; hx of chlamydia and BV, and trichomoniasis; Tobo-ovarian abscess; bilateral laparoscopic salpingectomy 03/08/20  are also affecting patient's functional outcome.   REHAB POTENTIAL: Excellent  CLINICAL DECISION MAKING: Evolving/moderate complexity  EVALUATION COMPLEXITY: Moderate   GOALS: Goals reviewed with patient? Yes  SHORT TERM GOALS: Target date: 11/03/22  Patient is independent with initial HEP for pelvic floor relaxation and elongation.  Baseline: Goal status: Met 01/17/23  2.  Patient is able to perform diaphragmatic breathing with expansion of the lower rib cage.  Baseline:  Goal status: Met 01/22/23  3.  Patient is able to contract her lower abdominals correctly without lifting her lower rib cage.  Baseline:  Goal status: ongoing 01/27/23  4.  Educated on how to toilet correctly with breath and elongation of pelvic floor Baseline:  Goal status: Met 01/27/23   LONG TERM GOALS: Target date: 07/27/23  Patient is independent with advanced HEP for core and pelvic floor strength.  Baseline:  Goal status: ongoing 01/27/23  2.  Patient reports straining with bowel movement </= 50% to reduce pressure on the pelvic floor.  Baseline:  Goal status: ongoing 01/27/23  3.  Patient is able to have feel herself fully empty her bladder and not have to return to urinate after 2-3 hours.  Baseline:  Goal status: ongoing 01/27/23  4.  Patient reports pain with penile penetration vaginally decreased 1-2/10 due to elongation of tissue.  Baseline:  Goal status: ongoing 01/27/23  5.  Patient is able to use the vaginal wand to reduce trigger  points in the pelvic floor when she has increased pain and to manage pain.  Baseline:  Goal status: Met 01/27/23   PLAN:  PT FREQUENCY: 1-2x/week  PT DURATION: other: 4 months  PLANNED INTERVENTIONS: Therapeutic exercises, Therapeutic activity, Neuromuscular re-education, Patient/Family education, Joint mobilization, Dry Needling, Electrical stimulation, Spinal mobilization, Cryotherapy, Moist heat, Taping, Ultrasound, Biofeedback, and Manual therapy  PLAN FOR NEXT SESSION: dry needling to right iliococcygeus;  mobilize the right SI and posterior right hip; manual work to the bladder  Eulis Foster, PT 01/27/23 3:37 PM  PHYSICAL THERAPY DISCHARGE SUMMARY  Visits from Start of Care: 5  Current functional level related to goals / functional outcomes: See above. Patient has not returned to physical therapy since 01/27/23.    Remaining deficits: See above.    Education / Equipment: HEP   Patient agrees to discharge. Patient goals were not met. Patient is being discharged due to not returning since the last visit. Thank you for the referral.   Eulis Foster, PT 04/23/23 1:17 PM

## 2023-04-16 ENCOUNTER — Ambulatory Visit: Payer: 59 | Admitting: Physical Therapy

## 2023-04-23 ENCOUNTER — Encounter: Payer: 59 | Admitting: Physical Therapy

## 2023-04-30 ENCOUNTER — Encounter: Payer: 59 | Admitting: Physical Therapy

## 2023-05-07 ENCOUNTER — Encounter: Payer: 59 | Admitting: Physical Therapy

## 2023-07-07 ENCOUNTER — Encounter: Payer: Self-pay | Admitting: Obstetrics and Gynecology

## 2023-07-09 ENCOUNTER — Other Ambulatory Visit: Payer: Self-pay | Admitting: Lactation Services

## 2023-07-09 DIAGNOSIS — N809 Endometriosis, unspecified: Secondary | ICD-10-CM

## 2023-07-09 MED ORDER — NORETHINDRONE ACETATE 5 MG PO TABS: 5.0000 mg | ORAL_TABLET | Freq: Every day | ORAL | 6 refills | Status: AC

## 2023-08-20 ENCOUNTER — Other Ambulatory Visit: Payer: Self-pay

## 2023-08-20 ENCOUNTER — Ambulatory Visit: Admitting: Obstetrics and Gynecology

## 2023-08-20 ENCOUNTER — Other Ambulatory Visit (HOSPITAL_COMMUNITY)
Admission: RE | Admit: 2023-08-20 | Discharge: 2023-08-20 | Disposition: A | Discharge status: Home / Self Care | Source: Ambulatory Visit | Attending: Obstetrics and Gynecology | Admitting: Obstetrics and Gynecology

## 2023-08-20 VITALS — BP 129/88 | HR 84 | Wt 227.4 lb

## 2023-08-20 DIAGNOSIS — N941 Unspecified dyspareunia: Secondary | ICD-10-CM | POA: Diagnosis not present

## 2023-08-20 DIAGNOSIS — Z113 Encounter for screening for infections with a predominantly sexual mode of transmission: Secondary | ICD-10-CM

## 2023-08-20 DIAGNOSIS — Z124 Encounter for screening for malignant neoplasm of cervix: Secondary | ICD-10-CM | POA: Insufficient documentation

## 2023-08-20 DIAGNOSIS — Z01419 Encounter for gynecological examination (general) (routine) without abnormal findings: Secondary | ICD-10-CM | POA: Diagnosis present

## 2023-08-20 DIAGNOSIS — Z1331 Encounter for screening for depression: Secondary | ICD-10-CM

## 2023-08-20 MED ORDER — ESTRADIOL 10 MCG VA TABS: 1.0000 | ORAL_TABLET | VAGINAL | 12 refills | Status: AC

## 2023-08-20 NOTE — Progress Notes (Signed)
 "  ANNUAL EXAM Patient name: Mackenzie Harris MRN [id number]  Date of birth: [birth year] Chief Complaint:   Gynecologic Exam  History of Present Illness:   Mackenzie Harris is a 33 y.o. G0P0000 being seen today for a routine annual exam.  Current complaints: pap, vaginal burning, decreased libido  Menstrual concerns? No  endometriosis suppression with aygestin , amenorrheic Breast or nipple changes? No  Contraception use? Yes aygestin  Sexually active? Yes when allows   Discussed the use of AI scribe software for clinical note transcription with the patient, who gave verbal consent to proceed.  History of Present Illness Mackenzie Harris is a 33 year old female with endometriosis who presents for STD testing and management of endometriosis symptoms.  She is experiencing significant symptoms related to endometriosis and is taking norethindrone  daily. While on norethindrone , she does not menstruate but experiences mood swings and fatigue, which she finds manageable compared to the pain prior to treatment.  She discontinued Aygestin  approximately six months ago due to cost. She experiences vaginal dryness and dyspareunia, describing a burning sensation during insertion and cramping. She has tried vaginal estrogen cream but found it expensive and not significantly effective. Even a finger near the vaginal opening causes burning. She attended pelvic floor therapy and uses recommended tools and exercises at home but discontinued therapy due to cost.  She has a history of using Depo Provera but discontinued it due to side effects. She experiences hormonal acne, particularly on her chin, and has had an abscess that required antibiotic treatment. She suspects her diet and vitamin B12 intake may contribute to her acne and is considering dietary changes to reduce inflammation.  She is sexually active but notes a decreased libido, which she attributes to pain and hormonal suppression. She has experienced bacterial  vaginosis frequently in the past but has not had recent symptoms. She has adjusted her laundry detergents to reduce irritation.  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 She is currently taking norethindrone  daily and is taking omega-3 supplements due to limited dietary intake of omega-rich foods.   No LMP recorded. (Menstrual status: Other).   The pregnancy intention screening data noted above was reviewed. Potential methods of contraception were discussed. The patient elected to proceed with No data recorded.   Last pap     Component Value Date/Time   DIAGPAP  09/21/2020 1153    - Negative for intraepithelial lesion or malignancy (NILM)   HPVHIGH Negative 09/21/2020 1153   ADEQPAP  09/21/2020 1153    Satisfactory for evaluation; transformation zone component PRESENT.   Last mammogram: n/a.  Last colonoscopy: n/a.      08/20/2023   11:20 AM 08/01/2022   10:07 AM 09/21/2020   10:09 AM 03/30/2020    8:22 AM 10/20/2019    8:26 AM  Depression screen PHQ 2/9  Decreased Interest 1 0 0 0 1  Down, Depressed, Hopeless 0 0 1 0 0  PHQ - 2 Score 1 0 1 0 1  Altered sleeping 1 0 2 0 1  Tired, decreased energy 1 0 1 0 1  Change in appetite 1 0 1 0 0  Feeling bad or failure about yourself  0 0 0 0 0  Trouble concentrating 1 0 0 1 1  Moving slowly or fidgety/restless 0 0 0 0 0  Suicidal thoughts 0 0 0 0 0  PHQ-9 Score 5 0 5 1 4         08/20/2023  11:20 AM 08/01/2022   10:08 AM 09/21/2020   10:09 AM 03/30/2020    8:23 AM  GAD 7 : Generalized Anxiety Score  Nervous, Anxious, on Edge 0 0 1 0  Control/stop worrying 0 0 0 0  Worry too much - different things 0 0 1 0  Trouble relaxing 1 0 1 0  Restless 1 0 0 0  Easily annoyed or irritable 1 0 1 1  Afraid - awful might happen 0 0 0 0  Total GAD 7 Score 3 0 4 1     Review of Systems:   Pertinent items are noted in HPI Denies any headaches, blurred vision, fatigue, shortness of breath,  chest pain, abdominal pain, abnormal vaginal discharge/itching/odor/irritation, problems with periods, bowel movements, urination, or intercourse unless otherwise stated above. Pertinent History Reviewed:  Reviewed past medical,surgical, social and family history.  Reviewed problem list, medications and allergies. Physical Assessment:   Vitals:   08/20/23 1116  BP: 129/88  Pulse: 84  Weight: 227 lb 6.4 oz (103.1 kg)  Body mass index is 36.7 kg/m.        Physical Examination:   General appearance - well appearing, and in no distress  Mental status - alert, oriented to person, place, and time  Psych:  She has a normal mood and affect  Skin - warm and dry, normal color, no suspicious lesions noted  Chest - effort normal, all lung fields clear to auscultation bilaterally  Heart - normal rate and regular rhythm  Abdomen - soft, nontender, nondistended, no masses or organomegaly  Pelvic -  VULVA: Small 108m labia minora cyst on right; erythematous areas at 4 and 8 oclock of vaginal introitus with corresponding allodynia   VAGINA: normal appearing vagina with normal color and discharge, no lesions   CERVIX: normal appearing cervix without discharge or lesions, no CMT  Thin prep pap is done with HR HPV cotesting  UTERUS: uterus is felt to be normal size, shape, consistency and nontender   ADNEXA: right sided tenderness, No left adnexal masses or tenderness noted.  Extremities:  No swelling or varicosities noted  Chaperone present for exam  No results found for this or any previous visit (from the past 24 hours).    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 Vaginal dryness and burning persist, indicating inadequate response. Previous treatments limited by cost and efficacy. Differential includes nerve-related pain. - Continue  norethindrone  daily. - Prescribe vaginal estrogen tablets for dryness and burning. - Provided lubricant samples. - Consider systemic estrogen if symptoms persist. - Evaluate need for nerve medication if symptoms persist.  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 Pain reduction may improve libido. - Focus on pain reduction with vaginal estrogen tablets and lubricant. - Discuss systemic estrogen if symptoms persist vs other neuromodulator  Acne and history of recurrent skin abscesses Recurrent acne and abscesses possibly linked to hormonal imbalance or diet. Recent abscess required antibiotics. Suspected dietary triggers include energy drinks and vitamin B12. - Encourage dietary modifications, reduce energy drink consumption.  Obesity Obesity with ongoing lifestyle modifications. Advised to follow a whole foods diet, increase protein, and exercise regularly. - Encourage continuation of lifestyle modifications.   Orders Placed This Encounter  Procedures   RPR+HBsAg+HCVAb+...    Meds: No orders of the defined types were placed in this encounter.  Follow-up: No follow-ups on file.  Carter Quarry, MD 08/20/2023 11:41 AM "

## 2023-08-22 ENCOUNTER — Ambulatory Visit: Payer: Self-pay | Admitting: Obstetrics and Gynecology

## 2023-08-22 LAB — RPR+HBSAG+HCVAB+...
HIV Screen 4th Generation wRfx: NONREACTIVE
Hep C Virus Ab: NONREACTIVE
Hepatitis B Surface Ag: NEGATIVE
RPR Ser Ql: NONREACTIVE

## 2023-08-25 LAB — CYTOLOGY - PAP
Chlamydia: NEGATIVE
Comment: NEGATIVE
Comment: NEGATIVE
Comment: NEGATIVE
Comment: NORMAL
Diagnosis: NEGATIVE
High risk HPV: NEGATIVE
Neisseria Gonorrhea: NEGATIVE
Trichomonas: NEGATIVE
